# Patient Record
Sex: Male | Born: 1995 | Race: Black or African American | Hispanic: No | Marital: Single | Smoking: Former smoker
Health system: Southern US, Community
[De-identification: ages and names within clinical notes are randomized; demographics above are authoritative.]

## PROBLEM LIST (undated history)

## (undated) DIAGNOSIS — S62309A Unspecified fracture of unspecified metacarpal bone, initial encounter for closed fracture: Secondary | ICD-10-CM

---
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
## 2014-08-10 ENCOUNTER — Encounter (HOSPITAL_COMMUNITY): Payer: Self-pay | Admitting: Emergency Medicine

## 2014-08-10 ENCOUNTER — Emergency Department (HOSPITAL_COMMUNITY)
Admission: EM | Admit: 2014-08-10 | Discharge: 2014-08-10 | Disposition: A | Discharge status: Home / Self Care | Payer: No Typology Code available for payment source | Attending: Emergency Medicine | Admitting: Emergency Medicine

## 2014-08-10 DIAGNOSIS — F17200 Nicotine dependence, unspecified, uncomplicated: Secondary | ICD-10-CM | POA: Diagnosis not present

## 2014-08-10 DIAGNOSIS — K921 Melena: Secondary | ICD-10-CM | POA: Insufficient documentation

## 2014-08-10 DIAGNOSIS — K625 Hemorrhage of anus and rectum: Secondary | ICD-10-CM | POA: Insufficient documentation

## 2014-08-10 LAB — POC OCCULT BLOOD, ED: Fecal Occult Bld: NEGATIVE

## 2014-08-10 MED ORDER — HYDROCORTISONE ACETATE 25 MG RE SUPP: 25.0000 mg | Freq: Two times a day (BID) | RECTAL | Status: DC

## 2014-08-10 NOTE — ED Notes (Signed)
Pt reports 1 episode of bright red stool this AM. Denies any pain. Denies dizziness, lightheadedness. PT AO x4, NAD.

## 2014-08-10 NOTE — Discharge Instructions (Signed)
Rectal Bleeding  Rectal bleeding is when blood comes out of the opening of the butt (anus). Rectal bleeding may show up as bright red blood or really dark poop (stool). The poop may look dark red, maroon, or black. Rectal bleeding is often a sign that something is wrong. This needs to be checked by a doctor.  HOME CARE  Eat a diet high in fiber. This will help keep your poop soft.  Limit activity.  Drink enough fluids to keep your pee (urine) clear or pale yellow.  Take a warm bath to soothe any pain.  Follow up with your doctor as told. GET HELP RIGHT AWAY IF:  You have more bleeding.  You have black or dark red poop.  You throw up (vomit) blood or it looks like coffee grounds.  You have belly (abdominal) pain or tenderness.  You have a fever.  You feel weak, sick to your stomach (nauseous), or you pass out (faint).  You have pain that is so bad you cannot poop (bowel movement). MAKE SURE YOU:  Understand these instructions.  Will watch your condition.  Will get help right away if you are not doing well or get worse. Document Released: 08/16/2011 Document Revised: 04/20/2014 Document Reviewed: 08/16/2011 [employer] Patient Information 2015 [employer]. This information is not intended to replace advice given to you by your health care provider. Make sure you discuss any questions you have with your health care provider.  

## 2014-08-10 NOTE — ED Provider Notes (Signed)
CSN: [id number]     Arrival date & time 08/10/14  1101 History  This chart was scribed for non-physician practitioner, Junious Silk, PA-C,working with Toy Baker, MD, by Karle Plumber, ED Scribe. This patient was seen in room C26C/C26C and the patient's care was started at 2:09 PM.  Chief Complaint  Patient presents with  . Rectal Bleeding   Patient is a 18 y.o. male presenting with hematochezia. The history is provided by the patient. No language interpreter was used.  Rectal Bleeding Associated symptoms: no abdominal pain    HPI Comments:  Guy Clark is a 18 y.o. male who presents to the Emergency Department complaining of hematochezia that occurred one time earlier this morning. He states he saw blood in the toilet after a bowel movement and also noticed blood after wiping. The blood was not mixed in the stool. He denies constipation, abdominal pain, pain with bowel movements or any urinary symptoms. He denies taking any medications on a daily basis. Mother denies family h/o colon cancer. He does not have a PCP.  History reviewed. No pertinent past medical history. History reviewed. No pertinent past surgical history. No family history on file. History  Substance Use Topics  . Smoking status: Current Some Day Smoker  . Smokeless tobacco: Not on file  . Alcohol Use: No    Review of Systems  Gastrointestinal: Positive for hematochezia and anal bleeding. Negative for abdominal pain, constipation and blood in stool.  Genitourinary: Negative for dysuria, urgency, frequency, hematuria, decreased urine volume and difficulty urinating.  All other systems reviewed and are negative.   Allergies  Review of patient's allergies indicates no known allergies.  Home Medications   Prior to Admission medications   Not on File   Triage Vitals: BP 131/61  Pulse 66  Temp(Src) 97.8 F (36.6 C) (Oral)  Resp 20  SpO2 100% Physical Exam  Nursing note and vitals  reviewed. Constitutional: He is oriented to person, place, and time. He appears well-developed and well-nourished. No distress.  HENT:  Head: Normocephalic and atraumatic.  Right Ear: External ear normal.  Left Ear: External ear normal.  Nose: Nose normal.  Eyes: Conjunctivae are normal.  Neck: Normal range of motion. No tracheal deviation present.  Cardiovascular: Normal rate, regular rhythm and normal heart sounds.   Pulmonary/Chest: Effort normal and breath sounds normal. No stridor.  Abdominal: Soft. He exhibits no distension. There is no tenderness.  No tenderness to deep palpation of abdomen.  Genitourinary: Rectum normal.  Good sphincter tone on rectal exam. No external hemorrhoid seen. No fissures. No tenderness to palpation on rectal exam. Finger without gross blood or stool.  Musculoskeletal: Normal range of motion.  Neurological: He is alert and oriented to person, place, and time.  Skin: Skin is warm and dry. He is not diaphoretic.  Psychiatric: He has a normal mood and affect. His behavior is normal.    ED Course  Procedures (including critical care time) DIAGNOSTIC STUDIES: Oxygen Saturation is 100% on RA, normal by my interpretation.   COORDINATION OF CARE: 2:12 PM- Will send fecal sample for testing. Pt verbalizes understanding and agrees to plan.  Medications - No data to display  Labs Review Labs Reviewed  POC OCCULT BLOOD, ED    Imaging Review No results found.   EKG Interpretation None      MDM   Final diagnoses:  Rectal bleeding    Patient presents to ED for evaluation of BRBPR. This happened as 1 single occurrence. Hemoccult  negative on exam, however there was nothing on my finger during rectal exam. No external hemorrhoids or fissures. Possible internal hemorrhoids. No family history of colon cancer. No tenderness to deep palpation of abdomen. Patient is an otherwise healthy 18 year old male. Will give resource guide for PCP follow up.  Discussed reasons to return to ED immediately. Vital signs stable for discharge. Patient / Family / Caregiver informed of clinical course, understand medical decision-making process, and agree with plan.   I personally performed the services described in this documentation, which was scribed in my presence. The recorded information has been reviewed and is accurate.    Mora Bellman, PA-C 08/10/14 [phone]

## 2014-08-11 NOTE — ED Provider Notes (Signed)
Medical screening examination/treatment/procedure(s) were performed by non-physician practitioner and as supervising physician I was immediately available for consultation/collaboration.   EKG Interpretation None       Denasia Venn T Brittni Hult, MD 08/11/14 0741 

## 2014-08-24 ENCOUNTER — Emergency Department (HOSPITAL_COMMUNITY): Payer: No Typology Code available for payment source

## 2014-08-24 ENCOUNTER — Encounter (HOSPITAL_COMMUNITY): Payer: Self-pay | Admitting: Emergency Medicine

## 2014-08-24 ENCOUNTER — Emergency Department (HOSPITAL_COMMUNITY)
Admission: EM | Admit: 2014-08-24 | Discharge: 2014-08-24 | Disposition: A | Discharge status: Home / Self Care | Payer: No Typology Code available for payment source | Attending: Emergency Medicine | Admitting: Emergency Medicine

## 2014-08-24 DIAGNOSIS — S199XXA Unspecified injury of neck, initial encounter: Secondary | ICD-10-CM

## 2014-08-24 DIAGNOSIS — S069X9A Unspecified intracranial injury with loss of consciousness of unspecified duration, initial encounter: Secondary | ICD-10-CM

## 2014-08-24 DIAGNOSIS — S52123A Displaced fracture of head of unspecified radius, initial encounter for closed fracture: Secondary | ICD-10-CM | POA: Insufficient documentation

## 2014-08-24 DIAGNOSIS — F17200 Nicotine dependence, unspecified, uncomplicated: Secondary | ICD-10-CM | POA: Insufficient documentation

## 2014-08-24 DIAGNOSIS — Y9389 Activity, other specified: Secondary | ICD-10-CM | POA: Insufficient documentation

## 2014-08-24 DIAGNOSIS — S0990XA Unspecified injury of head, initial encounter: Secondary | ICD-10-CM | POA: Insufficient documentation

## 2014-08-24 DIAGNOSIS — S060X9A Concussion with loss of consciousness of unspecified duration, initial encounter: Secondary | ICD-10-CM | POA: Insufficient documentation

## 2014-08-24 DIAGNOSIS — M25521 Pain in right elbow: Secondary | ICD-10-CM

## 2014-08-24 DIAGNOSIS — S069X1A Unspecified intracranial injury with loss of consciousness of 30 minutes or less, initial encounter: Secondary | ICD-10-CM

## 2014-08-24 DIAGNOSIS — S52121A Displaced fracture of head of right radius, initial encounter for closed fracture: Secondary | ICD-10-CM

## 2014-08-24 DIAGNOSIS — S60229A Contusion of unspecified hand, initial encounter: Secondary | ICD-10-CM | POA: Insufficient documentation

## 2014-08-24 DIAGNOSIS — S0993XA Unspecified injury of face, initial encounter: Secondary | ICD-10-CM | POA: Insufficient documentation

## 2014-08-24 DIAGNOSIS — R0789 Other chest pain: Secondary | ICD-10-CM

## 2014-08-24 DIAGNOSIS — S60221A Contusion of right hand, initial encounter: Secondary | ICD-10-CM

## 2014-08-24 DIAGNOSIS — M542 Cervicalgia: Secondary | ICD-10-CM

## 2014-08-24 DIAGNOSIS — S298XXA Other specified injuries of thorax, initial encounter: Secondary | ICD-10-CM | POA: Insufficient documentation

## 2014-08-24 MED ORDER — MORPHINE SULFATE 4 MG/ML IJ SOLN
6.0000 mg | Freq: Once | INTRAMUSCULAR | Status: AC
  Administered 2014-08-24: 6 mg via INTRAMUSCULAR
  Filled 2014-08-24: qty 2

## 2014-08-24 MED ORDER — NAPROXEN 500 MG PO TABS: 500.0000 mg | ORAL_TABLET | Freq: Two times a day (BID) | ORAL | Status: DC

## 2014-08-24 MED ORDER — HYDROCODONE-ACETAMINOPHEN 5-325 MG PO TABS
2.0000 | ORAL_TABLET | ORAL | Status: DC | PRN
Start: 2014-08-24 — End: 2016-07-01

## 2014-08-24 NOTE — ED Notes (Signed)
Pt removed C-Collar. Stated it was uncomfortable and he does not want to wear it anymore. Pt has been for CT cervical spine, waiting for results.
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
## 2014-08-24 NOTE — ED Notes (Signed)
Patient being transported to x-ray.

## 2014-08-28 ENCOUNTER — Telehealth (HOSPITAL_BASED_OUTPATIENT_CLINIC_OR_DEPARTMENT_OTHER): Payer: Self-pay | Admitting: Emergency Medicine

## 2016-07-01 ENCOUNTER — Emergency Department (HOSPITAL_COMMUNITY)
Admission: EM | Admit: 2016-07-01 | Discharge: 2016-07-01 | Disposition: A | Discharge status: Home / Self Care | Payer: No Typology Code available for payment source | Source: Home / Self Care | Attending: Emergency Medicine | Admitting: Emergency Medicine

## 2016-07-01 ENCOUNTER — Encounter (HOSPITAL_COMMUNITY): Payer: Self-pay

## 2016-07-01 ENCOUNTER — Emergency Department (HOSPITAL_COMMUNITY)
Admission: EM | Admit: 2016-07-01 | Discharge: 2016-07-01 | Disposition: A | Discharge status: Home / Self Care | Payer: No Typology Code available for payment source | Attending: Emergency Medicine | Admitting: Emergency Medicine

## 2016-07-01 DIAGNOSIS — Z79899 Other long term (current) drug therapy: Secondary | ICD-10-CM

## 2016-07-01 DIAGNOSIS — F17200 Nicotine dependence, unspecified, uncomplicated: Secondary | ICD-10-CM

## 2016-07-01 DIAGNOSIS — Y990 Civilian activity done for income or pay: Secondary | ICD-10-CM | POA: Insufficient documentation

## 2016-07-01 DIAGNOSIS — S3994XA Unspecified injury of external genitals, initial encounter: Secondary | ICD-10-CM

## 2016-07-01 DIAGNOSIS — Y929 Unspecified place or not applicable: Secondary | ICD-10-CM | POA: Insufficient documentation

## 2016-07-01 DIAGNOSIS — S3021XA Contusion of penis, initial encounter: Secondary | ICD-10-CM | POA: Insufficient documentation

## 2016-07-01 DIAGNOSIS — W1839XA Other fall on same level, initial encounter: Secondary | ICD-10-CM | POA: Insufficient documentation

## 2016-07-01 DIAGNOSIS — Y9389 Activity, other specified: Secondary | ICD-10-CM

## 2016-07-01 DIAGNOSIS — Y999 Unspecified external cause status: Secondary | ICD-10-CM | POA: Insufficient documentation

## 2016-07-01 DIAGNOSIS — N4821 Abscess of corpus cavernosum and penis: Secondary | ICD-10-CM | POA: Insufficient documentation

## 2016-07-01 DIAGNOSIS — L0291 Cutaneous abscess, unspecified: Secondary | ICD-10-CM

## 2016-07-01 DIAGNOSIS — Y939 Activity, unspecified: Secondary | ICD-10-CM | POA: Insufficient documentation

## 2016-07-01 DIAGNOSIS — W2209XA Striking against other stationary object, initial encounter: Secondary | ICD-10-CM | POA: Insufficient documentation

## 2016-07-01 DIAGNOSIS — T148XXA Other injury of unspecified body region, initial encounter: Secondary | ICD-10-CM

## 2016-07-01 MED ORDER — LIDOCAINE HCL (PF) 1 % IJ SOLN
5.0000 mL | Freq: Once | INTRAMUSCULAR | Status: AC
  Administered 2016-07-01: 5 mL
  Filled 2016-07-01: qty 5

## 2016-07-01 MED ORDER — NAPROXEN 500 MG PO TABS: 500.0000 mg | ORAL_TABLET | Freq: Two times a day (BID) | ORAL | Status: DC

## 2016-07-01 MED ORDER — SULFAMETHOXAZOLE-TRIMETHOPRIM 800-160 MG PO TABS: 1.0000 | ORAL_TABLET | Freq: Two times a day (BID) | ORAL | Status: AC

## 2016-07-01 MED ORDER — HYDROCODONE-ACETAMINOPHEN 5-325 MG PO TABS: 2.0000 | ORAL_TABLET | ORAL | Status: DC | PRN

## 2016-07-01 NOTE — ED Provider Notes (Signed)
CSN: [id number]     Arrival date & time 07/01/16  1948 History  By signing my name below, I, Majel Homer, attest that this documentation has been prepared under the direction and in the presence of non-physician practitioner, Melburn Hake, PA-C. Electronically Signed: Majel Homer, Scribe. 07/01/2016. 8:20 PM.   Chief Complaint  Patient presents with  . Penis Injury   The history is provided by the patient. No language interpreter was used.   HPI Comments: Guy Clark is a 20 y.o. male who presents to the Emergency Department complaining of gradually worsening, penile pain and swelling s/p a fall that occurred 4 days ago. Pt reports he lost his balance on wet floor while at work 4 days ago and struck his penis against a door. He denies hitting his head or loss of consciousness at the time of fall. He states he took ibuprofen with mild relief. Pt was seen today at [facility] at ~6pm this evening by myself for the same symptoms in which his penis was aspirated due to concern for contusion vs abscess, no fluid was able to be aspirated and pt was d/c home with symptomatic tx and pain meds for suspected contusion. Pt states he was picking up his prescription when he felt wet near his penis and saw blood and a quarter size amount of purulent discharge from the aspiration site of his penis. Pt reports his discharge looked like a mixture of red and white fluid that he described as cloudiness. Denies fever, chills, abdominal pain, N/V, difficulty urinating, hematuria, testicular pain or swelling, worsening penile swelling.    History reviewed. No pertinent past medical history. History reviewed. No pertinent past surgical history. History reviewed. No pertinent family history. Social History  Substance Use Topics  . Smoking status: Current Some Day Smoker  . Smokeless tobacco: None  . Alcohol Use: No    Review of Systems  Constitutional: Negative for fever.  Genitourinary: Positive for penile swelling and  penile pain.   Allergies  Review of patient's allergies indicates no known allergies.  Home Medications   Prior to Admission medications   Medication Sig Start Date End Date Taking? Authorizing Provider  HYDROcodone-acetaminophen (NORCO/VICODIN) 5-325 MG tablet Take 2 tablets by mouth every 4 (four) hours as needed. 07/01/16   Barrett Henle, PA-C  naproxen (NAPROSYN) 500 MG tablet Take 1 tablet (500 mg total) by mouth 2 (two) times daily. 07/01/16   Barrett Henle, PA-C  sulfamethoxazole-trimethoprim (BACTRIM DS,SEPTRA DS) 800-160 MG tablet Take 1 tablet by mouth 2 (two) times daily. 07/01/16 07/08/16  Satira Sark Betul Brisky, PA-C   BP 143/67 mmHg  Pulse 68  Temp(Src) 98.3 F (36.8 C) (Oral)  Resp 16  Ht  (1.753 m)  Wt 204 lb 4 oz (92.647 kg)  BMI 30.15 kg/m2  SpO2 99% Physical Exam  Constitutional: He is oriented to person, place, and time. He appears well-developed and well-nourished.  HENT:  Head: Normocephalic and atraumatic.  Eyes: Conjunctivae and EOM are normal. Right eye exhibits no discharge. Left eye exhibits no discharge. No scleral icterus.  Pulmonary/Chest: Effort normal.  Genitourinary:    Right testis shows no mass, no swelling and no tenderness. Left testis shows no mass, no swelling and no tenderness. Circumcised. Penile tenderness present. No phimosis, paraphimosis, hypospadias or penile erythema.  2x1 cm area of fluctuance and swelling noted to left lateral penile shaft, TTP  No tenderness to remaining penis or penile shaft No surrounding erythema, drainage or ecchymosis noted  No active bleeding or drainage noted   Neurological: He is alert and oriented to person, place, and time.  Nursing note and vitals reviewed.   ED Course  .Marland Kitchen.Incision and Drainage Date/Time: 07/01/2016 8:45 PM Performed by: Barrett HenleNADEAU, Bhavin Monjaraz ELIZABETH Authorized by: Barrett HenleNADEAU, Austin Herd ELIZABETH Consent: Verbal consent obtained. Risks and benefits: risks, benefits and  alternatives were discussed Consent given by: patient Patient understanding: patient states understanding of the procedure being performed Patient identity confirmed: verbally with patient Type: abscess Location: penile shaft. Anesthesia: local infiltration Local anesthetic: lidocaine 1% without epinephrine Anesthetic total: 2 ml Scalpel size: 11 Needle gauge: 25. Incision type: single straight Incision depth: dermal Complexity: simple Drainage: purulent Drainage amount: moderate Wound treatment: wound left open Packing material: none Patient tolerance: Patient tolerated the procedure well with no immediate complications    DIAGNOSTIC STUDIES:  Oxygen Saturation is 99% on RA, normal by my interpretation.    COORDINATION OF CARE:  8:18 PM Discussed treatment plan, which includes I&D of penis with pt at bedside and pt agreed to plan.  Labs Review Labs Reviewed - No data to display  Imaging Review No results found. I have personally reviewed and evaluated these images and lab results as part of my medical decision-making.   EKG Interpretation None      MDM   Final diagnoses:  Abscess    Patient presents with drainage from penile abscess. Patient was seen in the ED earlier this evening by myself for penile pain and swelling after falling resulting in a contusion to his penile shaft. The pt's penis was aspirated due to concern for contusion vs abscess, no fluid was able to be aspirated and pt was d/c home with symptomatic tx and pain meds for suspected contusion. Patient reports a few hours after leaving the ED he noticed a small amount of blood and purulent drainage from aspiration site. Denies fever or any other changes in his sxs. VSS. Exam revealed area of fluctuance to left lateral penile shaft with tenderness to palpation, no drainage. Exam unchanged from initial evaluation earlier this evening. Due to patient reporting purulent drainage, will perform I&D for suspected  abscess. I&D of area resulted in moderate amount of purulent drainage to superficial skin abscess. Procedure completed without any complications. Plan to discharge patient home with Bactrim, wound care and plan to follow up with PCP in 2 days for wound recheck. Discussed return precautions with patient.  I personally performed the services described in this documentation, which was scribed in my presence. The recorded information has been reviewed and is accurate.    Satira Sarkicole Elizabeth SpragueNadeau, [place] JerseyPA-C 07/01/16 2136  Bethann BerkshireJoseph Zammit, MD 07/01/16 2245

## 2016-07-01 NOTE — ED Notes (Signed)
Pt reports injury to penis 06-27-16.  Was seen in ED today for same.  Pt noted bleeding on way home from hospital, returns to have examined.  No bleeding at this time.

## 2016-07-01 NOTE — ED Provider Notes (Signed)
CSN: [id number]     Arrival date & time 07/01/16  1610 History  By signing my name below, I, Majel Homer, attest that this documentation has been prepared under the direction and in the presence of non-physician practitioner, Melburn Hake, PA-C. Electronically Signed: Majel Homer, Scribe. 07/01/2016. 6:04 PM.  Chief Complaint  Patient presents with  . Groin Swelling   The history is provided by the patient. No language interpreter was used.   HPI Comments: Guy Clark is a 20 y.o. male who presents to the Emergency Department complaining of gradually worsening, penile pain s/p a fall that occurred 4 days ago and worsened today. Pt notes associated penile swelling. Pt reports he loss his balance on wet floor while at work 4 days ago and struck his penis against a door. He denies hitting his head or loss of consciousness at the time of his fall. He states he took ibuprofen yesterday with mild intermittent relief. Pt denies difficulty urinating, dysuria, hematuria, swelling or pain in his testicles, penile discharge, abdominal pain, nausea, and vomiting.   History reviewed. No pertinent past medical history. History reviewed. No pertinent past surgical history. No family history on file. Social History  Substance Use Topics  . Smoking status: Current Some Day Smoker  . Smokeless tobacco: None  . Alcohol Use: No    Review of Systems  Gastrointestinal: Negative for nausea, vomiting and abdominal pain.  Genitourinary: Positive for penile swelling. Negative for dysuria, hematuria, difficulty urinating, penile pain and testicular pain.  Musculoskeletal: Positive for myalgias.  Neurological: Negative for syncope.   Allergies  Review of patient's allergies indicates no known allergies.  Home Medications   Prior to Admission medications   Medication Sig Start Date End Date Taking? Authorizing Provider  HYDROcodone-acetaminophen (NORCO/VICODIN) 5-325 MG tablet Take 2 tablets by mouth every 4  (four) hours as needed. 07/01/16   Barrett Henle, PA-C  naproxen (NAPROSYN) 500 MG tablet Take 1 tablet (500 mg total) by mouth 2 (two) times daily. 07/01/16   Satira Sark Chiquitta Matty, PA-C   BP 131/72 mmHg  Temp(Src) 98.5 F (36.9 C) (Oral)  Resp 18  SpO2 99% Physical Exam  Constitutional: He is oriented to person, place, and time. He appears well-developed and well-nourished.  HENT:  Head: Normocephalic and atraumatic.  Eyes: Conjunctivae and EOM are normal. Right eye exhibits no discharge. Left eye exhibits no discharge. No scleral icterus.  Cardiovascular: Normal rate, regular rhythm, normal heart sounds and intact distal pulses.   Pulmonary/Chest: Effort normal and breath sounds normal.  Abdominal: Soft. Bowel sounds are normal. There is no tenderness. Hernia confirmed negative in the right inguinal area and confirmed negative in the left inguinal area.  Genitourinary: Testes normal.    Right testis shows no mass, no swelling and no tenderness. Left testis shows no mass, no swelling and no tenderness. Circumcised. Penile tenderness present. No phimosis, paraphimosis, hypospadias or penile erythema. No discharge found.  2x1 cm area of fluctuance and swelling noted to left lateral penile shaft, TTP  No tenderness to remaining penis or penile shaft No surrounding erythema, drainage or ecchymosis noted  Musculoskeletal: He exhibits no edema.  Lymphadenopathy:       Right: No inguinal adenopathy present.  Neurological: He is alert and oriented to person, place, and time.  Skin: Skin is warm and dry.  Nursing note and vitals reviewed.  ED Course  Procedures  DIAGNOSTIC STUDIES:  Oxygen Saturation is 99% on RA, normal by my interpretation.  COORDINATION OF CARE:  5:58 PM Discussed treatment plan with pt at bedside and pt agreed to plan.  Labs Review Labs Reviewed - No data to display  Imaging Review No results found. I have personally reviewed and evaluated these  images and lab results as part of my medical decision-making.   EKG Interpretation None      MDM   Final diagnoses:  Contusion  Penile trauma, initial encounter    Patient presents with penile pain and swelling status post fall that occurred 5 days ago. Denies head injury or LOC. Denies use of anticoagulants. Denies any other associated symptoms. VSS. Exam revealed area of swelling and fluctuance to left lateral penile shaft, tender to palpation, no surrounding erythema drainage or ecchymosis. Remaining GU exam unremarkable. Abdominal exam benign. Due to concern for contusion versus abscess, area was aspirated. No fluid, blood or pus was aspirated. I suspect patient's symptoms are likely due to contusion associated with recent injury. Plan to discharge patient home with pain meds and symptomatic treatment. Advised patient to follow up with his family doctor in 5 days for wound recheck. Discussed return precautions with patient.  I personally performed the services described in this documentation, which was scribed in my presence. The recorded information has been reviewed and is accurate.    Satira Sarkicole Elizabeth Misericordia UniversityNadeau, [place] JerseyPA-C 07/01/16 1852  Bethann BerkshireJoseph Zammit, MD 07/01/16 2245

## 2016-07-01 NOTE — Discharge Instructions (Signed)
Take your antibiotic prescription as prescribed until he completed the entire prescription. I recommended keeping wound clean using antibacterial soap and water, pat dry. Continue taking your prescriptions as prescribed as needed for pain relief. Follow up with your family doctor in the next 2 days for wound recheck. Return to the emergency department if symptoms worsen or new onset of fever, worsening redness, swelling, warmth, drainage, difficulty urinating, blood in urine, unable to urinate, abdominal pain, penile/testicular swelling, testicular pain.

## 2016-07-01 NOTE — ED Notes (Signed)
Pt reports relief. Dressing applied

## 2016-07-01 NOTE — ED Notes (Signed)
Patient was at work this past Tuesday and hit groin/penis on door edge. Complains of ongoing sweeling and discomfort to penis, has iced and ibuprofen with mild relief

## 2016-07-01 NOTE — ED Notes (Signed)
Mother w pt 

## 2016-07-01 NOTE — ED Notes (Signed)
PA at bedside.

## 2016-07-01 NOTE — ED Notes (Signed)
Pt given gown to put on for assessment

## 2016-07-01 NOTE — Discharge Instructions (Signed)
Take your medications as prescribed. I also recommend resting and applying ice to affected area for 15 minutes 3-4 times daily to help with pain and swelling. Follow-up with your family doctor within the next week for wound recheck. Return to the emergency department if symptoms worsen or new onset of fever, redness, swelling, warmth, worsening tenderness, drainage, difficulty urinating, blood in urine, penile discharge, penile swelling.

## 2017-06-17 DIAGNOSIS — S62309A Unspecified fracture of unspecified metacarpal bone, initial encounter for closed fracture: Secondary | ICD-10-CM

## 2017-06-17 HISTORY — DX: Unspecified fracture of unspecified metacarpal bone, initial encounter for closed fracture: S62.309A

## 2017-06-18 ENCOUNTER — Emergency Department (HOSPITAL_COMMUNITY): Payer: No Typology Code available for payment source

## 2017-06-18 ENCOUNTER — Encounter: Payer: Self-pay | Admitting: Orthopedic Surgery

## 2017-06-18 ENCOUNTER — Encounter (HOSPITAL_COMMUNITY): Payer: Self-pay | Admitting: Emergency Medicine

## 2017-06-18 ENCOUNTER — Emergency Department (HOSPITAL_COMMUNITY)
Admission: EM | Admit: 2017-06-18 | Discharge: 2017-06-18 | Disposition: A | Discharge status: Home / Self Care | Payer: No Typology Code available for payment source | Attending: Emergency Medicine | Admitting: Emergency Medicine

## 2017-06-18 DIAGNOSIS — W2189XA Striking against or struck by other sports equipment, initial encounter: Secondary | ICD-10-CM | POA: Insufficient documentation

## 2017-06-18 DIAGNOSIS — Z791 Long term (current) use of non-steroidal anti-inflammatories (NSAID): Secondary | ICD-10-CM | POA: Insufficient documentation

## 2017-06-18 DIAGNOSIS — Y929 Unspecified place or not applicable: Secondary | ICD-10-CM | POA: Insufficient documentation

## 2017-06-18 DIAGNOSIS — Y999 Unspecified external cause status: Secondary | ICD-10-CM | POA: Insufficient documentation

## 2017-06-18 DIAGNOSIS — S62315A Displaced fracture of base of fourth metacarpal bone, left hand, initial encounter for closed fracture: Secondary | ICD-10-CM | POA: Insufficient documentation

## 2017-06-18 DIAGNOSIS — Y9371 Activity, boxing: Secondary | ICD-10-CM | POA: Insufficient documentation

## 2017-06-18 DIAGNOSIS — F17200 Nicotine dependence, unspecified, uncomplicated: Secondary | ICD-10-CM | POA: Insufficient documentation

## 2017-06-18 MED ORDER — OXYCODONE-ACETAMINOPHEN 5-325 MG PO TABS
2.0000 | ORAL_TABLET | Freq: Once | ORAL | Status: AC
  Administered 2017-06-18: 2 via ORAL
  Filled 2017-06-18: qty 2

## 2017-06-18 MED ORDER — IBUPROFEN 800 MG PO TABS: 800.0000 mg | ORAL_TABLET | Freq: Three times a day (TID) | ORAL | 0 refills | Status: DC

## 2017-06-18 MED ORDER — OXYCODONE-ACETAMINOPHEN 5-325 MG PO TABS: 1.0000 | ORAL_TABLET | Freq: Four times a day (QID) | ORAL | 0 refills | Status: DC | PRN

## 2017-06-18 NOTE — ED Triage Notes (Signed)
Patient states that when he was practicing at gym when punching on bag, reports knuckles on left hand when backwards. Patient reports swelling and pain. Patient has hand wrapped in ace bandage.

## 2017-06-18 NOTE — Discharge Instructions (Addendum)
Keep splint intact and dry.  No lifting with left hand.  Take ibuprofen every 8 hours for your pain. For breakthrough pain, take 1-2 Percocet every 4-6 hours as needed for severe pain. Please follow-up with Dr. Carollee Massedhompson's office as directed.

## 2017-06-18 NOTE — ED Provider Notes (Signed)
[facility]-EMERGENCY DEPT Provider Note   CSN: [id number] Arrival date & time: 06/18/17  1028   By signing my name below, I, Guy Clark, attest that this documentation has been prepared under the direction and in the presence of Guy ReamAlexandra Emmert Roethler, PA-C Electronically Signed: Soijett Clark, ED Scribe. 06/18/17. 12:31 PM.  History   Chief Complaint Chief Complaint  Patient presents with  . Hand Injury    HPI Guy Clark is a 21 y.o. male who presents to the Emergency Department complaining of left hand injury occurring yesterday night. Pt reports associated left hand swelling, throbbing left hand pain, tingling sensation to left 4th fingers, and numbness to left 5th finger. Pt has tried applying an ace wrap without ice or medications with no relief of his symptoms. He reports that he was striking a punching bag at the gym when his left knuckles went backwards. Pt denies eating anything today PTA. He denies color change, wound, and any other symptoms.    The history is provided by the patient. No language interpreter was used.    History reviewed. No pertinent past medical history.  There are no active problems to display for this patient.   History reviewed. No pertinent surgical history.     Home Medications    Prior to Admission medications   Medication Sig Start Date End Date Taking? Authorizing Provider  HYDROcodone-acetaminophen (NORCO/VICODIN) 5-325 MG tablet Take 2 tablets by mouth every 4 (four) hours as needed. 07/01/16   Barrett HenleNadeau, Guy Elizabeth, PA-C  ibuprofen (ADVIL,MOTRIN) 800 MG tablet Take 1 tablet (800 mg total) by mouth 3 (three) times daily. 06/18/17   Curran Lenderman, Guy BogaAlexandra M, PA-C  naproxen (NAPROSYN) 500 MG tablet Take 1 tablet (500 mg total) by mouth 2 (two) times daily. 07/01/16   Barrett HenleNadeau, Guy Elizabeth, PA-C  oxyCODONE-acetaminophen (PERCOCET/ROXICET) 5-325 MG tablet Take 1-2 tablets by mouth every 6 (six) hours as needed for severe pain. 06/18/17   Emi HolesLaw, Guy Erker M, PA-C      Family History No family history on file.  Social History Social History  Substance Use Topics  . Smoking status: Current Some Day Smoker  . Smokeless tobacco: Never Used  . Alcohol use No     Allergies   Patient has no known allergies.   Review of Systems Review of Systems  Musculoskeletal: Positive for arthralgias (left hand) and joint swelling (left hand).  Skin: Negative for color change and wound.  Neurological: Positive for numbness (upper portion of left 5th finger).       +Tingling sensation to left 4th finger     Physical Exam Updated Vital Signs BP (!) 143/93   Pulse 66   Temp 98.2 F (36.8 C)   Resp 14   Ht 5\' 10"  (1.778 Clark)   Wt 215 lb (97.5 kg)   SpO2 98%   BMI 30.85 kg/Clark   Physical Exam  Constitutional: He is oriented to person, place, and time. He appears well-developed and well-nourished. No distress.  HENT:  Head: Normocephalic and atraumatic.  Eyes: EOM are normal.  Neck: Neck supple.  Cardiovascular: Normal rate, regular rhythm, normal heart sounds and intact distal pulses.  Exam reveals no gallop and no friction rub.   No murmur heard. Pulmonary/Chest: Effort normal. No respiratory distress.  Abdominal: He exhibits no distension.  Musculoskeletal:       Left hand: He exhibits decreased range of motion. He exhibits normal capillary refill.  Left hand: Flexion of left fourth digit, but no extension. Limited flexion of 5th  digit with no extension. Dorsal tenderness to fourth metacarpal. Palpable displaced bone. No 2 point discrimination to left 5th digit. Sensation intact to the left 4th digit. Cap refill intact.   Neurological: He is alert and oriented to person, place, and time.  Skin: Skin is warm and dry.  Psychiatric: He has a normal mood and affect. His behavior is normal.  Nursing note and vitals reviewed.    ED Treatments / Results  DIAGNOSTIC STUDIES: Oxygen Saturation is 98% on RA, nl by my interpretation.    COORDINATION  OF CARE: 12:30 PM Discussed treatment plan with pt at bedside which includes left hand xray, and pt agreed to plan.  12:52 PM- Consult with Orthopedic PA-C Charma Igo who informs that he will evaluate the pt in the ED.   1:44 PM- Orthopedic PA, Charma Igo to evaluate the pt and recommends splinting the left hand with an ulnar gutter splint, d/c with pain medications, and follow up in office for further evaluation.  Labs (all labs ordered are listed, but only abnormal results are displayed) Labs Reviewed - No data to display  EKG  EKG Interpretation None       Radiology Dg Hand Complete Left  Result Date: 06/18/2017 CLINICAL DATA:  21 year old male boxing and felt pain. Initial encounter. EXAM: LEFT HAND - COMPLETE 3+ VIEW COMPARISON:  None. FINDINGS: Transverse fracture of the proximal to mid aspect of the left fourth metacarpal shaft with posterior angulation of the distal fracture fragment and slight overlapping of fracture fragments. No other fracture noted. Question mild cystic changes of the lunate and triquetrum. IMPRESSION: Transverse fracture of the proximal to mid aspect of the left fourth metacarpal shaft with posterior angulation of the distal fracture fragment and slight overlapping of fracture fragments. Electronically Signed   By: Lacy Duverney Clark.D.   On: 06/18/2017 11:25    Procedures Procedures (including critical care time)  SPLINT APPLICATION Date/Time: 5:28 PM Authorized by: Guy Gilmore, PA-C Consent: Verbal consent obtained. Risks and benefits: risks, benefits and alternatives were discussed Consent given by: patient Splint applied by: orthopedic technician Location details: Left hand Splint type: ulnar gutter Supplies used: fiberglass, ACE wrap Post-procedure: The splinted body part was neurovascularly unchanged following the procedure. Patient tolerance: Patient tolerated the procedure well with no immediate  complications.     Medications Ordered in ED Medications  oxyCODONE-acetaminophen (PERCOCET/ROXICET) 5-325 MG per tablet 2 tablet (2 tablets Oral Given 06/18/17 1434)     Initial Impression / Assessment and Plan / ED Course  I have reviewed the triage vital signs and the nursing notes.  Pertinent labs & imaging results that were available during my care of the patient were reviewed by me and considered in my medical decision making (see chart for details).     X-ray of the left hand shows Transverse fracture of the proximal to mid aspect of the left fourth metacarpal shaft with posterior angulation of the distal fracture fragment and slight overlapping of fracture fragments. I consulted orthopedics and patient was evaluated by Charma Igo, PA-C. Patient will follow up with hand surgeon, Dr. Janee Morn, outpatient. Patient was placed in ulnar gutter splint. Patient discharged home with Percocet and ibuprofen for pain control. I reviewed the narcotic database and found no discrepancies. Patient understands and agrees with plan. Patient vitals stable throughout ED course and discharged in satisfactory condition.  Final Clinical Impressions(s) / ED Diagnoses   Final diagnoses:  Closed displaced fracture of base of fourth metacarpal bone  of left hand, initial encounter    New Prescriptions Discharge Medication List as of 06/18/2017  2:54 PM    START taking these medications   Details  ibuprofen (ADVIL,MOTRIN) 800 MG tablet Take 1 tablet (800 mg total) by mouth 3 (three) times daily., Starting Mon 06/18/2017, Print    oxyCODONE-acetaminophen (PERCOCET/ROXICET) 5-325 MG tablet Take 1-2 tablets by mouth every 6 (six) hours as needed for severe pain., Starting Mon 06/18/2017, Print       I personally performed the services described in this documentation, which was scribed in my presence. The recorded information has been reviewed and is accurate.     Emi Holes, PA-C 06/18/17  1728    Mancel Bale, MD 06/18/17 2045

## 2017-06-18 NOTE — Consult Note (Signed)
Reason for Consult:4th [facility] fx Referring Physician: E Deantre Bourdon is an 21 y.o. male.  HPI: Guy Clark was working on a heavy bag last night when he felt his knuckles bend backward and had pain. He didn't think it was too bad and went home. It was still hurting and swollen this morning so he came to the ED for evaluation. X-rays showed a left 4th MC fx and hand surgery was consulted. He is RHD.  History reviewed. No pertinent past medical history.  History reviewed. No pertinent surgical history.  No family history on file.  Social History:  reports that he has been smoking.  He has never used smokeless tobacco. He reports that he does not drink alcohol or use drugs.  Allergies: No Known Allergies  Medications: I have reviewed the patient's current medications.  No results found for this or any previous visit (from the past 48 hour(s)).  Dg Hand Complete Left  Result Date: 06/18/2017 CLINICAL DATA:  21 year old male boxing and felt pain. Initial encounter. EXAM: LEFT HAND - COMPLETE 3+ VIEW COMPARISON:  None. FINDINGS: Transverse fracture of the proximal to mid aspect of the left fourth metacarpal shaft with posterior angulation of the distal fracture fragment and slight overlapping of fracture fragments. No other fracture noted. Question mild cystic changes of the lunate and triquetrum. IMPRESSION: Transverse fracture of the proximal to mid aspect of the left fourth metacarpal shaft with posterior angulation of the distal fracture fragment and slight overlapping of fracture fragments. Electronically Signed   By: Lacy Duverney M.D.   On: 06/18/2017 11:25    Review of Systems  Constitutional: Negative for weight loss.  HENT: Negative for ear discharge, ear pain, hearing loss and tinnitus.   Eyes: Negative for blurred vision, double vision, photophobia and pain.  Respiratory: Negative for cough, sputum production and shortness of breath.   Cardiovascular: Negative for chest pain.   Gastrointestinal: Negative for abdominal pain, nausea and vomiting.  Genitourinary: Negative for dysuria, flank pain, frequency and urgency.  Musculoskeletal: Positive for joint pain (Left hand). Negative for back pain, falls, myalgias and neck pain.  Neurological: Negative for dizziness, tingling, sensory change, focal weakness, loss of consciousness and headaches.  Endo/Heme/Allergies: Does not bruise/bleed easily.  Psychiatric/Behavioral: Negative for depression, memory loss and substance abuse. The patient is not nervous/anxious.    Blood pressure (!) 143/93, pulse 66, temperature 98.2 F (36.8 C), resp. rate 14, height 5\' 10"  (1.778 m), weight 97.5 kg (215 lb), SpO2 98 %. Physical Exam  Constitutional: He appears well-developed and well-nourished. No distress.  HENT:  Head: Normocephalic.  Eyes: Conjunctivae are normal. Right eye exhibits no discharge. Left eye exhibits no discharge. No scleral icterus.  Cardiovascular: Normal rate and regular rhythm.   Respiratory: Effort normal. No respiratory distress.  Musculoskeletal:  Left shoulder, elbow, wrist, digits- no skin wounds, hand TTP ulnar w/edema, no instability, motion limited 2/2 pain  Sens  Ax/R/M/U intact, 5th digit and hypothenar numb  Mot   Ax/ R/ PIN/ M/ AIN/ U intact but limited 2/2 pain  Rad 2+   Neurological: He is alert.  Skin: Skin is warm and dry. He is not diaphoretic.  Psychiatric: He has a normal mood and affect. His behavior is normal.    Assessment/Plan: Boxing injury Left 4th MC fx -- Angulated by about 45 degrees by x-ray so will likely need CRPP. Will place in ulnar gutter splint, keep NWB, and plan to see as OP with Dr. Janee Morn. The office  will call pt to set up appt.    Guy CaldronMichael J. Jeffery, PA-C Orthopedic Surgery [phone] 06/18/2017, 1:32 PM    4MC midshaft angulated and displaced fx, closed, will benefit from skeletal reduction and fixation.  Arrangements to be made for surgery in the next  few days.  Neil Crouchave Osbaldo Mark, MD Hand Surgery

## 2017-06-19 ENCOUNTER — Other Ambulatory Visit: Payer: Self-pay | Admitting: Orthopedic Surgery

## 2017-06-19 ENCOUNTER — Encounter (HOSPITAL_BASED_OUTPATIENT_CLINIC_OR_DEPARTMENT_OTHER): Payer: Self-pay | Admitting: *Deleted

## 2017-06-25 ENCOUNTER — Encounter (HOSPITAL_BASED_OUTPATIENT_CLINIC_OR_DEPARTMENT_OTHER): Payer: Self-pay | Admitting: *Deleted

## 2017-06-25 ENCOUNTER — Ambulatory Visit (HOSPITAL_BASED_OUTPATIENT_CLINIC_OR_DEPARTMENT_OTHER)
Admission: RE | Admit: 2017-06-25 | Discharge: 2017-06-25 | Disposition: A | Discharge status: Home / Self Care | Payer: Self-pay | Source: Ambulatory Visit | Attending: Orthopedic Surgery | Admitting: Orthopedic Surgery

## 2017-06-25 ENCOUNTER — Ambulatory Visit (HOSPITAL_BASED_OUTPATIENT_CLINIC_OR_DEPARTMENT_OTHER): Payer: Self-pay | Admitting: Anesthesiology

## 2017-06-25 ENCOUNTER — Encounter (HOSPITAL_BASED_OUTPATIENT_CLINIC_OR_DEPARTMENT_OTHER)
Admission: RE | Disposition: A | Discharge status: Home / Self Care | Payer: Self-pay | Source: Ambulatory Visit | Attending: Orthopedic Surgery

## 2017-06-25 ENCOUNTER — Ambulatory Visit (HOSPITAL_COMMUNITY): Payer: Self-pay

## 2017-06-25 DIAGNOSIS — Z87891 Personal history of nicotine dependence: Secondary | ICD-10-CM | POA: Insufficient documentation

## 2017-06-25 DIAGNOSIS — X58XXXA Exposure to other specified factors, initial encounter: Secondary | ICD-10-CM | POA: Insufficient documentation

## 2017-06-25 DIAGNOSIS — S62325A Displaced fracture of shaft of fourth metacarpal bone, left hand, initial encounter for closed fracture: Secondary | ICD-10-CM | POA: Insufficient documentation

## 2017-06-25 DIAGNOSIS — Z419 Encounter for procedure for purposes other than remedying health state, unspecified: Secondary | ICD-10-CM

## 2017-06-25 HISTORY — DX: Unspecified fracture of unspecified metacarpal bone, initial encounter for closed fracture: S62.309A

## 2017-06-25 HISTORY — PX: CLOSED REDUCTION METACARPAL WITH PERCUTANEOUS PINNING: SHX5613

## 2017-06-25 SURGERY — CLOSED REDUCTION, FRACTURE, METACARPAL BONE, WITH PERCUTANEOUS PINNING
Anesthesia: General | Site: Finger | Laterality: Left

## 2017-06-25 MED ORDER — BUPIVACAINE-EPINEPHRINE (PF) 0.5% -1:200000 IJ SOLN
INTRAMUSCULAR | Status: DC | PRN
  Administered 2017-06-25: 10 mL via PERINEURAL

## 2017-06-25 MED ORDER — OXYCODONE HCL 5 MG PO TABS: 5.0000 mg | ORAL_TABLET | Freq: Once | ORAL | Status: DC | PRN

## 2017-06-25 MED ORDER — LIDOCAINE HCL (CARDIAC) 20 MG/ML IV SOLN
INTRAVENOUS | Status: DC | PRN
  Administered 2017-06-25: 60 mg via INTRAVENOUS

## 2017-06-25 MED ORDER — HYDROMORPHONE HCL 1 MG/ML IJ SOLN
INTRAMUSCULAR | Status: AC
  Filled 2017-06-25: qty 0.5

## 2017-06-25 MED ORDER — ONDANSETRON HCL 4 MG/2ML IJ SOLN: 4.0000 mg | Freq: Four times a day (QID) | INTRAMUSCULAR | Status: DC | PRN

## 2017-06-25 MED ORDER — ONDANSETRON HCL 4 MG/2ML IJ SOLN
INTRAMUSCULAR | Status: DC | PRN
  Administered 2017-06-25: 4 mg via INTRAVENOUS

## 2017-06-25 MED ORDER — OXYCODONE HCL 5 MG/5ML PO SOLN: 5.0000 mg | Freq: Once | ORAL | Status: DC | PRN

## 2017-06-25 MED ORDER — CEFAZOLIN SODIUM-DEXTROSE 2-4 GM/100ML-% IV SOLN
INTRAVENOUS | Status: AC
  Filled 2017-06-25: qty 100

## 2017-06-25 MED ORDER — FENTANYL CITRATE (PF) 100 MCG/2ML IJ SOLN
50.0000 ug | INTRAMUSCULAR | Status: AC | PRN
  Administered 2017-06-25 (×2): 50 ug via INTRAVENOUS
  Administered 2017-06-25: 25 ug via INTRAVENOUS

## 2017-06-25 MED ORDER — HYDROMORPHONE HCL 1 MG/ML IJ SOLN
0.2500 mg | INTRAMUSCULAR | Status: DC | PRN
  Administered 2017-06-25 (×2): 0.25 mg via INTRAVENOUS

## 2017-06-25 MED ORDER — ACETAMINOPHEN 325 MG PO TABS: 650.0000 mg | ORAL_TABLET | Freq: Four times a day (QID) | ORAL | Status: DC | PRN

## 2017-06-25 MED ORDER — DEXAMETHASONE SODIUM PHOSPHATE 10 MG/ML IJ SOLN
INTRAMUSCULAR | Status: AC
  Filled 2017-06-25: qty 2

## 2017-06-25 MED ORDER — LACTATED RINGERS IV SOLN: INTRAVENOUS | Status: DC

## 2017-06-25 MED ORDER — SCOPOLAMINE 1 MG/3DAYS TD PT72
1.0000 | MEDICATED_PATCH | Freq: Once | TRANSDERMAL | Status: DC | PRN
Start: 2017-06-25 — End: 2017-06-25

## 2017-06-25 MED ORDER — MIDAZOLAM HCL 2 MG/2ML IJ SOLN
INTRAMUSCULAR | Status: AC
  Filled 2017-06-25: qty 2

## 2017-06-25 MED ORDER — LACTATED RINGERS IV SOLN
INTRAVENOUS | Status: DC
  Administered 2017-06-25 (×2): via INTRAVENOUS

## 2017-06-25 MED ORDER — MIDAZOLAM HCL 2 MG/2ML IJ SOLN
1.0000 mg | INTRAMUSCULAR | Status: DC | PRN
  Administered 2017-06-25: 2 mg via INTRAVENOUS

## 2017-06-25 MED ORDER — FENTANYL CITRATE (PF) 100 MCG/2ML IJ SOLN
INTRAMUSCULAR | Status: AC
  Filled 2017-06-25: qty 2

## 2017-06-25 MED ORDER — IBUPROFEN 200 MG PO TABS: 600.0000 mg | ORAL_TABLET | Freq: Four times a day (QID) | ORAL | Status: DC | PRN

## 2017-06-25 MED ORDER — DEXAMETHASONE SODIUM PHOSPHATE 10 MG/ML IJ SOLN
INTRAMUSCULAR | Status: DC | PRN
  Administered 2017-06-25: 10 mg via INTRAVENOUS

## 2017-06-25 MED ORDER — PROPOFOL 10 MG/ML IV BOLUS
INTRAVENOUS | Status: DC | PRN
  Administered 2017-06-25: 260 mg via INTRAVENOUS

## 2017-06-25 MED ORDER — LIDOCAINE HCL (CARDIAC) 20 MG/ML IV SOLN
INTRAVENOUS | Status: AC
  Filled 2017-06-25: qty 20

## 2017-06-25 MED ORDER — CEFAZOLIN SODIUM-DEXTROSE 2-4 GM/100ML-% IV SOLN
2.0000 g | INTRAVENOUS | Status: AC
  Administered 2017-06-25: 2 g via INTRAVENOUS

## 2017-06-25 MED ORDER — PROPOFOL 500 MG/50ML IV EMUL
INTRAVENOUS | Status: AC
  Filled 2017-06-25: qty 50

## 2017-06-25 MED ORDER — ONDANSETRON HCL 4 MG/2ML IJ SOLN
INTRAMUSCULAR | Status: AC
  Filled 2017-06-25: qty 14

## 2017-06-25 SURGICAL SUPPLY — 42 items
BANDAGE COBAN STERILE 2 (GAUZE/BANDAGES/DRESSINGS) IMPLANT
BLADE MINI RND TIP GREEN BEAV (BLADE) IMPLANT
BLADE SURG 15 STRL LF DISP TIS (BLADE) ×1 IMPLANT
BLADE SURG 15 STRL SS (BLADE) ×2
BNDG COHESIVE 4X5 TAN STRL (GAUZE/BANDAGES/DRESSINGS) ×3 IMPLANT
BNDG ESMARK 4X9 LF (GAUZE/BANDAGES/DRESSINGS) IMPLANT
BNDG GAUZE ELAST 4 BULKY (GAUZE/BANDAGES/DRESSINGS) ×3 IMPLANT
CHLORAPREP W/TINT 26ML (MISCELLANEOUS) ×3 IMPLANT
CORD BIPOLAR FORCEPS 12FT (ELECTRODE) IMPLANT
COVER BACK TABLE 60X90IN (DRAPES) ×3 IMPLANT
COVER MAYO STAND STRL (DRAPES) ×3 IMPLANT
CUFF TOURNIQUET SINGLE 18IN (TOURNIQUET CUFF) IMPLANT
DRAPE C-ARM 42X72 X-RAY (DRAPES) ×3 IMPLANT
DRAPE EXTREMITY T 121X128X90 (DRAPE) ×3 IMPLANT
DRAPE SURG 17X23 STRL (DRAPES) ×3 IMPLANT
DRSG EMULSION OIL 3X3 NADH (GAUZE/BANDAGES/DRESSINGS) ×3 IMPLANT
GAUZE SPONGE 4X4 12PLY STRL LF (GAUZE/BANDAGES/DRESSINGS) ×3 IMPLANT
GLOVE BIO SURGEON STRL SZ7.5 (GLOVE) ×3 IMPLANT
GLOVE BIOGEL PI IND STRL 7.0 (GLOVE) ×1 IMPLANT
GLOVE BIOGEL PI IND STRL 8 (GLOVE) ×2 IMPLANT
GLOVE BIOGEL PI INDICATOR 7.0 (GLOVE) ×2
GLOVE BIOGEL PI INDICATOR 8 (GLOVE) ×4
GLOVE ECLIPSE 6.5 STRL STRAW (GLOVE) ×3 IMPLANT
GOWN STRL REUS W/TWL XL LVL3 (GOWN DISPOSABLE) ×3 IMPLANT
K-WIRE .045X4 (WIRE) IMPLANT
NEEDLE HYPO 22GX1.5 SAFETY (NEEDLE) IMPLANT
NS IRRIG 1000ML POUR BTL (IV SOLUTION) ×3 IMPLANT
PACK BASIN DAY SURGERY FS (CUSTOM PROCEDURE TRAY) ×3 IMPLANT
PADDING CAST ABS 4INX4YD NS (CAST SUPPLIES)
PADDING CAST ABS COTTON 4X4 ST (CAST SUPPLIES) IMPLANT
RUBBERBAND STERILE (MISCELLANEOUS) ×3 IMPLANT
SPLINT PLASTER CAST XFAST 4X15 (CAST SUPPLIES) IMPLANT
SPLINT PLASTER XTRA FAST SET 4 (CAST SUPPLIES)
STOCKINETTE 6  STRL (DRAPES) ×2
STOCKINETTE 6 STRL (DRAPES) ×1 IMPLANT
SUT VICRYL RAPIDE 4-0 (SUTURE) IMPLANT
SUT VICRYL RAPIDE 4/0 PS 2 (SUTURE) IMPLANT
SYR 10ML LL (SYRINGE) IMPLANT
SYR BULB 3OZ (MISCELLANEOUS) IMPLANT
TOWEL OR 17X24 6PK STRL BLUE (TOWEL DISPOSABLE) ×3 IMPLANT
TOWEL OR NON WOVEN STRL DISP B (DISPOSABLE) ×3 IMPLANT
UNDERPAD 30X30 (UNDERPADS AND DIAPERS) ×3 IMPLANT

## 2017-06-25 NOTE — Anesthesia Procedure Notes (Signed)
Procedure Name: LMA Insertion Date/Time: 06/25/2017 11:19 AM Performed by: Iya Hamed D Pre-anesthesia Checklist: Patient identified, Emergency Drugs available, Suction available and Patient being monitored Patient Re-evaluated:Patient Re-evaluated prior to inductionOxygen Delivery Method: Circle system utilized Preoxygenation: Pre-oxygenation with 100% oxygen Intubation Type: IV induction Ventilation: Mask ventilation without difficulty LMA: LMA inserted LMA Size: 4.0 Number of attempts: 1 Airway Equipment and Method: Bite block Placement Confirmation: positive ETCO2 Tube secured with: Tape Dental Injury: Teeth and Oropharynx as per pre-operative assessment

## 2017-06-25 NOTE — Discharge Instructions (Addendum)
Discharge Instructions   You have a dressing with a plaster splint incorporated in it. Move your fingers as much as possible, making a full fist and fully opening the fist. Elevate your hand to reduce pain & swelling of the digits.  Ice over the operative site may be helpful to reduce pain & swelling.  DO NOT USE HEAT. Use Advil and Tylenol as we discussed, and you may also use some of the oxycodone prescribed in the emergency department if needed. Leave the dressing in place until you return to our office.  You may shower, but keep the bandage clean & dry.  You may drive a car when you are off of prescription pain medications and can safely control your vehicle with both hands.   Please call [phone] during normal business hours or [phone] after hours for any problems. Including the following:  - excessive redness of the incisions - drainage for more than 4 days - fever of more than 101.5 F  *Please note that pain medications will not be refilled after hours or on weekends.  WORK STATUS:  With the left hand, must keep the splint clean and dry, with no lifting, gripping, grasping greater than 5 pounds    Post Anesthesia Home Care Instructions  Activity: Get plenty of rest for the remainder of the day. A responsible individual must stay with you for 24 hours following the procedure.  For the next 24 hours, DO NOT: -Drive a car -Advertising copywriterperate machinery -Drink alcoholic beverages -Take any medication unless instructed by your physician -Make any legal decisions or sign important papers.  Meals: Start with liquid foods such as gelatin or soup. Progress to regular foods as tolerated. Avoid greasy, spicy, heavy foods. If nausea and/or vomiting occur, drink only clear liquids until the nausea and/or vomiting subsides. Call your physician if vomiting continues.  Special Instructions/Symptoms: Your throat may feel dry or sore from the anesthesia or the breathing tube placed in your  throat during surgery. If this causes discomfort, gargle with warm salt water. The discomfort should disappear within 24 hours.  If you had a scopolamine patch placed behind your ear for the management of post- operative nausea and/or vomiting:  1. The medication in the patch is effective for 72 hours, after which it should be removed.  Wrap patch in a tissue and discard in the trash. Wash hands thoroughly with soap and water. 2. You may remove the patch earlier than 72 hours if you experience unpleasant side effects which may include dry mouth, dizziness or visual disturbances. 3. Avoid touching the patch. Wash your hands with soap and water after contact with the patch.

## 2017-06-25 NOTE — Op Note (Signed)
06/25/2017  9:40 AM  PATIENT:  Guy Clark  21 y.o. male  PRE-OPERATIVE DIAGNOSIS:  Displaced left fourth metacarpal shaft fracture  POST-OPERATIVE DIAGNOSIS:  Same  PROCEDURE:  CRPP left fourth metacarpal shaft fracture  SURGEON: Cliffton Astersavid A. Janee Mornhompson, MD  PHYSICIAN ASSISTANT: None  ANESTHESIA:  general  SPECIMENS:  None  DRAINS:   None  EBL:  less than 50 mL  PREOPERATIVE INDICATIONS:  Guy Clark is a  21 y.o. male with displaced angulated left fourth metacarpal shaft fracture  The risks benefits and alternatives were discussed with the patient preoperatively including but not limited to the risks of infection, bleeding, nerve injury, cardiopulmonary complications, the need for revision surgery, among others, and the patient verbalized understanding and consented to proceed.  OPERATIVE IMPLANTS: 0.062 inch K wire placed intramedullary.  OPERATIVE PROCEDURE:  After receiving prophylactic antibiotics, the patient was escorted to the operative theatre and placed in a supine position.  A surgical "time-out" was performed during which the planned procedure, proposed operative site, and the correct patient identity were compared to the operative consent and agreement confirmed by the circulating nurse according to current facility policy.  Following application of a tourniquet to the operative extremity, the exposed skin was prepped with Chloraprep and draped in the usual sterile fashion.  The tourniquet was not inflated  A small incision was marked fluoroscopically at the base of the fourth metacarpal, skin incision made, followed by spreading dissection down to the dorsal base of the metacarpal.  An entry hole was created with a 0.062 inch K wire.  This was removed, a sharp and clipped off, and the tip bent into a slight skid shape.  It was then inserted by hand to the fracture site where the fracture was reduced to manipulation and the K wire driven by hand across the canal into the  distal fragment.  It was advanced sufficiently into the distal fragment, into the head of the metacarpal, and impaction was used to help compress the fractured fracture site.  Proximally the K wire was bent over 90/90 and clipped.  Percent Marcaine was instilled about the pin site for postoperative pain control.  A short arm splint dressing was applied with a volar plaster component, and he was  awakened and taken to the recovery room stable condition, breathing spontaneously  DISPOSITION: He'll be discharged home today with typical instructions, returning in 7-10 days for reevaluation, with new x-rays of the left hand out of splint and conversion to short arm cast.

## 2017-06-25 NOTE — Transfer of Care (Signed)
Immediate Anesthesia Transfer of Care Note  Patient: Guy SearBennie D Crist  Procedure(s) Performed: Procedure(s): PINNING OF LEFT 4TH METACARPAL FRACTURE (Left)  Patient Location: PACU  Anesthesia Type:General  Level of Consciousness: awake and patient cooperative  Airway & Oxygen Therapy: Patient Spontanous Breathing and Patient connected to face mask oxygen  Post-op Assessment: Report given to RN and Post -op Vital signs reviewed and stable  Post vital signs: Reviewed and stable  Last Vitals:  Vitals:   06/25/17 0907 06/25/17 1155  BP: 138/67   Pulse: 70 (!) 101  Resp: 20 (!) 34  Temp: 37.1 C     Last Pain:  Vitals:   06/25/17 0907  TempSrc: Oral  PainSc:          Complications: No apparent anesthesia complications

## 2017-06-25 NOTE — Anesthesia Preprocedure Evaluation (Signed)
Anesthesia Evaluation  Patient identified by MRN, date of birth, ID band Patient awake    Reviewed: Allergy & Precautions, H&P , NPO status , Patient's Chart, lab work & pertinent test results  Airway Mallampati: II   Neck ROM: full    Dental   Pulmonary former smoker,    breath sounds clear to auscultation       Cardiovascular negative cardio ROS   Rhythm:regular Rate:Normal     Neuro/Psych    GI/Hepatic   Endo/Other    Renal/GU      Musculoskeletal   Abdominal   Peds  Hematology   Anesthesia Other Findings   Reproductive/Obstetrics                             Anesthesia Physical Anesthesia Plan  ASA: I  Anesthesia Plan: General   Post-op Pain Management:    Induction: Intravenous  PONV Risk Score and Plan: 3 and Ondansetron, Dexamethasone, Propofol, Midazolam and Treatment may vary due to age or medical condition  Airway Management Planned: LMA  Additional Equipment:   Intra-op Plan:   Post-operative Plan:   Informed Consent: I have reviewed the patients History and Physical, chart, labs and discussed the procedure including the risks, benefits and alternatives for the proposed anesthesia with the patient or authorized representative who has indicated his/her understanding and acceptance.     Plan Discussed with: CRNA, Anesthesiologist and Surgeon  Anesthesia Plan Comments:         Anesthesia Quick Evaluation

## 2017-06-25 NOTE — H&P (Signed)
Guy Clark is an 21 y.o. male.   Chief Complaint: left hand fx HPI: This patient presented with a left hand injury.  ED w/u revealed L 4 MC fx. Patient failed to keep office appt last week, but presents today for operative reduction/stabilization.  Past Medical History:  Diagnosis Date  . Metacarpal bone fracture 06/17/2017   left 4th    History reviewed. No pertinent surgical history.  History reviewed. No pertinent family history. Social History:  reports that he quit smoking about 9 years ago. He has never used smokeless tobacco. He reports that he does not drink alcohol or use drugs.  Allergies: No Known Allergies  Medications Prior to Admission  Medication Sig Dispense Refill  . oxyCODONE-acetaminophen (PERCOCET/ROXICET) 5-325 MG tablet Take 1-2 tablets by mouth every 6 (six) hours as needed for severe pain. 15 tablet 0    No results found for this or any previous visit (from the past 48 hour(s)). No results found.  Review of Systems  All other systems reviewed and are negative.   Blood pressure 138/67, pulse 70, temperature 98.8 F (37.1 C), temperature source Oral, resp. rate 20, height 5\' 10"  (1.778 m), weight 101.2 kg (223 lb), SpO2 100 %. Physical Exam  Constitutional: He is oriented to person, place, and time. He appears well-developed and well-nourished.  HENT:  Head: Normocephalic and atraumatic.  Eyes: Pupils are equal, round, and reactive to light.  Neck: Normal range of motion. Neck supple.  Cardiovascular: Intact distal pulses.   Respiratory: Effort normal.  GI: Soft.  Musculoskeletal:  Left hand splint in place, TTP dorsal 4MC with obvious palpable deformity, mild malrotation, NVI  Neurological: He is alert and oriented to person, place, and time.  Skin: Skin is warm and dry.  Psychiatric: He has a normal mood and affect. His behavior is normal. Judgment and thought content normal.     Assessment/Plan L 4 MC shaft fx  I discussed these findings  with him and the rationale for operative reduction and stabilization, hopefully with an intramedullary K wire, but with open plating if necessary.  Questions were invited and answered.  Goals, risks, and options were reviewed and informed consent obtained, document executed.  We will plan for him to follow-up in roughly a week, at which time he will need new x-rays of the left hand out of splint and likely conversion to short arm cast.  Guy Clark A., MD 06/25/2017, 9:32 AM

## 2017-06-25 NOTE — Anesthesia Postprocedure Evaluation (Signed)
Anesthesia Post Note  Patient: Guy SearBennie D Clark  Procedure(s) Performed: Procedure(s) (LRB): PINNING OF LEFT 4TH METACARPAL FRACTURE (Left)     Patient location during evaluation: PACU Anesthesia Type: General Level of consciousness: awake and alert Pain management: pain level controlled Vital Signs Assessment: post-procedure vital signs reviewed and stable Respiratory status: spontaneous breathing, nonlabored ventilation, respiratory function stable and patient connected to nasal cannula oxygen Cardiovascular status: blood pressure returned to baseline and stable Postop Assessment: no signs of nausea or vomiting Anesthetic complications: no    Last Vitals:  Vitals:   06/25/17 1245 06/25/17 1300  BP: 128/77 130/81  Pulse: 84 61  Resp: (!) 22 13  Temp:      Last Pain:  Vitals:   06/25/17 1300  TempSrc:   PainSc: 3                  Jahquez Steffler

## 2017-06-26 ENCOUNTER — Encounter (HOSPITAL_BASED_OUTPATIENT_CLINIC_OR_DEPARTMENT_OTHER): Payer: Self-pay | Admitting: Orthopedic Surgery

## 2018-02-15 ENCOUNTER — Other Ambulatory Visit: Payer: Self-pay

## 2018-02-15 ENCOUNTER — Emergency Department (HOSPITAL_COMMUNITY): Payer: No Typology Code available for payment source

## 2018-02-15 ENCOUNTER — Encounter (HOSPITAL_COMMUNITY): Payer: Self-pay

## 2018-02-15 ENCOUNTER — Emergency Department (HOSPITAL_COMMUNITY)
Admission: EM | Admit: 2018-02-15 | Discharge: 2018-02-15 | Disposition: A | Discharge status: Home / Self Care | Payer: No Typology Code available for payment source | Attending: Emergency Medicine | Admitting: Emergency Medicine

## 2018-02-15 DIAGNOSIS — J111 Influenza due to unidentified influenza virus with other respiratory manifestations: Secondary | ICD-10-CM | POA: Insufficient documentation

## 2018-02-15 DIAGNOSIS — Z87891 Personal history of nicotine dependence: Secondary | ICD-10-CM | POA: Insufficient documentation

## 2018-02-15 DIAGNOSIS — R69 Illness, unspecified: Secondary | ICD-10-CM

## 2018-02-15 LAB — COMPREHENSIVE METABOLIC PANEL
ALBUMIN: 3.7 g/dL (ref 3.5–5.0)
ALK PHOS: 76 U/L (ref 38–126)
ALT: 34 U/L (ref 17–63)
AST: 25 U/L (ref 15–41)
Anion gap: 8 (ref 5–15)
BILIRUBIN TOTAL: 0.6 mg/dL (ref 0.3–1.2)
BUN: 8 mg/dL (ref 6–20)
CALCIUM: 8.7 mg/dL — AB (ref 8.9–10.3)
CO2: 24 mmol/L (ref 22–32)
Chloride: 106 mmol/L (ref 101–111)
Creatinine, Ser: 1.13 mg/dL (ref 0.61–1.24)
GFR calc Af Amer: >60 mL/min (ref >60)
GFR calc non Af Amer: >60 mL/min (ref >60)
GLUCOSE: 90 mg/dL (ref 65–99)
POTASSIUM: 3.7 mmol/L (ref 3.5–5.1)
Sodium: 138 mmol/L (ref 135–145)
TOTAL PROTEIN: 7.1 g/dL (ref 6.5–8.1)

## 2018-02-15 LAB — CBC WITH DIFFERENTIAL/PLATELET
BASOS ABS: 0 10*3/uL (ref 0.0–0.1)
Basophils Relative: 1 %
EOS PCT: 1 %
Eosinophils Absolute: 0 10*3/uL (ref 0.0–0.7)
HCT: 45.1 % (ref 39.0–52.0)
Hemoglobin: 14.7 g/dL (ref 13.0–17.0)
LYMPHS PCT: 25 %
Lymphs Abs: 1.6 10*3/uL (ref 0.7–4.0)
MCH: 28.1 pg (ref 26.0–34.0)
MCHC: 32.6 g/dL (ref 30.0–36.0)
MCV: 86.2 fL (ref 78.0–100.0)
MONO ABS: 0.8 10*3/uL (ref 0.1–1.0)
Monocytes Relative: 12 %
Neutro Abs: 4 10*3/uL (ref 1.7–7.7)
Neutrophils Relative %: 61 %
PLATELETS: 265 10*3/uL (ref 150–400)
RBC: 5.23 MIL/uL (ref 4.22–5.81)
RDW: 13.5 % (ref 11.5–15.5)
WBC: 6.4 10*3/uL (ref 4.0–10.5)

## 2018-02-15 LAB — I-STAT CG4 LACTIC ACID, ED: Lactic Acid, Venous: 0.88 mmol/L (ref 0.5–1.9)

## 2018-02-15 MED ORDER — ACETAMINOPHEN 325 MG PO TABS
650.0000 mg | ORAL_TABLET | Freq: Once | ORAL | Status: AC | PRN
  Administered 2018-02-15: 650 mg via ORAL
  Filled 2018-02-15: qty 2

## 2018-02-15 NOTE — Discharge Instructions (Signed)
Your symptoms are most consistent with a viral upper respiratory infection, possibly influenza. Your labs and chest x-ray were normal.   The main treatment approach for a viral upper respiratory infection and/or influenza is to treat the symptoms, support your immune system and prevent spread of illness. Stay well-hydrated. Rest. Take ibuprofen or Tylenol around the clock to help with associated fevers, sore throat, headaches, generalized body aches and malaise. Use an over-the-counter nasal steroid spray (like Flonase) to help with nasal congestion, runny nose and postnasal drip.  Wash your hands often to prevent spread.  A viral upper respiratory infection and/or influenza typically lasts 5-7 days.  Symptoms resolve slowly.  However, a viral upper respiratory infection can also worsen and progress into pneumonia.  Monitor your symptoms. If your symptoms worsen, persist and you develop persistent fevers, chest pain, productive cough you should follow up with your primary care provider.

## 2018-02-15 NOTE — ED Provider Notes (Signed)
[facility] EMERGENCY DEPARTMENT Provider Note   CSN: [id number] Arrival date & time: 02/15/18  1531     History   Chief Complaint Chief Complaint  Patient presents with  . Influenza    HPI Guy Clark is a 22 y.o. male w/o pmh here for evaluation of "flu like symptoms" since yesterday. Reports nasal congestion, rhinorrhea, cough with yellow sputum, headache, body aches, nausea, vomiting x 2, diarrhea and fevers. Actually feels better and just finished eating a bag of potato chips. No known sick contacts. No ho immunocompromise. Able to keep fluids and food down.  Denies Cp, SOB, headache, neck stiffness, abdominal pain, urinary symptoms.  HPI  Past Medical History:  Diagnosis Date  . Metacarpal bone fracture 06/17/2017   left 4th    There are no active problems to display for this patient.   Past Surgical History:  Procedure Laterality Date  . CLOSED REDUCTION METACARPAL WITH PERCUTANEOUS PINNING Left 06/25/2017   Procedure: PINNING OF LEFT 4TH METACARPAL FRACTURE;  Surgeon: Mack Hook, MD;  Location: [facility];  Service: Orthopedics;  Laterality: Left;       Home Medications    Prior to Admission medications   Medication Sig Start Date End Date Taking? Authorizing Provider  acetaminophen (TYLENOL) 325 MG tablet Take 2 tablets (650 mg total) by mouth every 6 (six) hours as needed for mild pain or moderate pain. 06/25/17   Mack Hook, MD  ibuprofen (ADVIL) 200 MG tablet Take 3 tablets (600 mg total) by mouth every 6 (six) hours as needed for mild pain or moderate pain. 06/25/17   Mack Hook, MD  oxyCODONE-acetaminophen (PERCOCET/ROXICET) 5-325 MG tablet Take 1-2 tablets by mouth every 6 (six) hours as needed for severe pain. 06/18/17   Emi Holes, PA-C    Family History History reviewed. No pertinent family history.  Social History Social History   Tobacco Use  . Smoking status: Former Smoker    Last attempt to  quit: 12/18/2007    Years since quitting: 10.1  . Smokeless tobacco: Never Used  Substance Use Topics  . Alcohol use: No  . Drug use: No     Allergies   Patient has no known allergies.   Review of Systems Review of Systems  Constitutional: Positive for chills and fever.  HENT: Positive for congestion, postnasal drip and rhinorrhea.   Respiratory: Positive for cough.   Gastrointestinal: Positive for diarrhea, nausea and vomiting.  Musculoskeletal: Positive for myalgias.  All other systems reviewed and are negative.    Physical Exam Updated Vital Signs BP (!) 146/79 (BP Location: Right Arm)   Pulse 98   Temp (!) 102.3 F (39.1 C) (Oral)   Resp 16   Ht 5\' 10"  (1.778 m)   Wt 106.6 kg (235 lb)   SpO2 100%   BMI 33.72 kg/m   Physical Exam  Constitutional: He is oriented to person, place, and time. He appears well-developed and well-nourished. No distress.  NAD. Eating potato chips.   HENT:  Head: Normocephalic and atraumatic.  Right Ear: External ear normal.  Left Ear: External ear normal.  Nose: Nose normal.  Moderate mucosal edema with clear rhinorrhea. MMM. TMs, oropharynx and tonsils normal. No sinus TTP.   Eyes: Conjunctivae and EOM are normal. No scleral icterus.  Neck: Normal range of motion. Neck supple.  No cervical lymphadenopathy. Full PROM of neck without pain or rigidity.   Cardiovascular: Normal rate, regular rhythm, normal heart sounds and intact distal  pulses.  No murmur heard. Pulmonary/Chest: Effort normal and breath sounds normal. He has no wheezes.  Abdominal: Soft. There is no tenderness.  Musculoskeletal: Normal range of motion. He exhibits no deformity.  Neurological: He is alert and oriented to person, place, and time.  Skin: Skin is warm and dry. Capillary refill takes less than 2 seconds.  Psychiatric: He has a normal mood and affect. His behavior is normal. Judgment and thought content normal.  Nursing note and vitals reviewed.    ED  Treatments / Results  Labs (all labs ordered are listed, but only abnormal results are displayed) Labs Reviewed  COMPREHENSIVE METABOLIC PANEL - Abnormal; Notable for the following components:      Result Value   Calcium 8.7 (*)    All other components within normal limits  CBC WITH DIFFERENTIAL/PLATELET  I-STAT CG4 LACTIC ACID, ED  I-STAT CG4 LACTIC ACID, ED    EKG  EKG Interpretation None       Radiology Dg Chest 2 View  Result Date: 02/15/2018 CLINICAL DATA:  Flu like symptoms EXAM: CHEST  2 VIEW COMPARISON:  08/24/2014 FINDINGS: Mild bronchitic changes. No consolidation or effusion. Normal heart size. No pneumothorax. IMPRESSION: Mild bronchitic changes without focal pulmonary infiltrate Electronically Signed   By: Jasmine PangKim  Fujinaga M.D.   On: 02/15/2018 17:20    Procedures Procedures (including critical care time)  Medications Ordered in ED Medications  acetaminophen (TYLENOL) tablet 650 mg (650 mg Oral Given 02/15/18 1638)     Initial Impression / Assessment and Plan / ED Course  I have reviewed the triage vital signs and the nursing notes.  Pertinent labs & imaging results that were available during my care of the patient were reviewed by me and considered in my medical decision making (see chart for details).    22 y.o. -year-old male with no pmh presents with URI like symptoms  2 days. On my exam patient is nontoxic appearing, speaking in full sentences, w/o increased WOB. No tachypnea, tachycardia, hypoxia. Lungs are CTAB. Triage RN ordered labs and CXR which I reviewed and unremarkable. No significant h/o immunocompromise. Doubt pneumonia.  Given reassuring physical exam, will discharge with symptomatic treatment. Strict ED return precautions given. Patient is aware that a viral URI infection may precede pneumonia or worsening illness. Patient is aware of red flag symptoms to monitor for that would warrant return to the ED for further reevaluation.    Final Clinical  Impressions(s) / ED Diagnoses   Final diagnoses:  Influenza-like illness    ED Discharge Orders    None       Jerrell MylarGibbons, Juliahna Wiswell J, PA-C 02/15/18 1912    Terrilee FilesButler, Michael C, MD 02/16/18 1135

## 2018-02-15 NOTE — ED Triage Notes (Signed)
Pt endorses flu sx, productive cough with yellow sputum, bodyaches and chills since yesterday. Pt state that his coworker had the same. Febrile in triage 102.3 oral. All over VSS. No medical hx.

## 2018-11-04 IMAGING — CR DG HAND COMPLETE 3+V*L*
3 series · 3 of 3 positions shown · non-contrast
Comparison: None.

CLINICAL DATA: 21-year-old male boxing and felt pain. Initial
encounter.

EXAM:
LEFT HAND - COMPLETE 3+ VIEW

[x hand pa left]
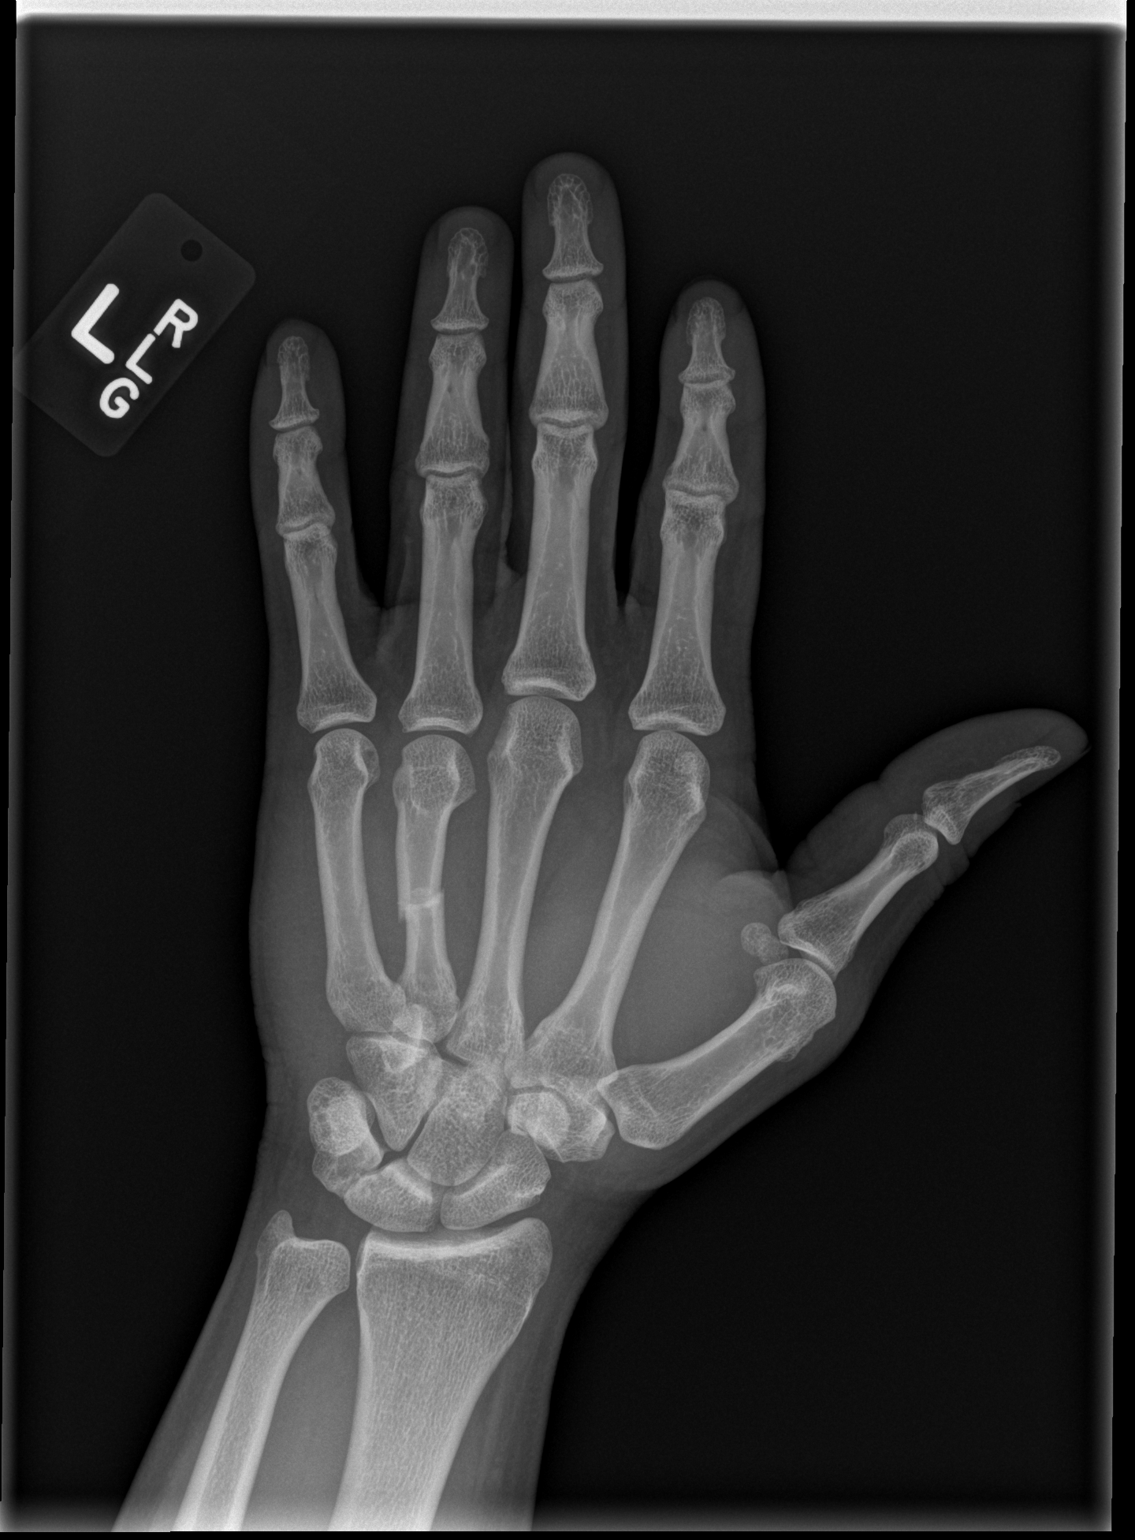

[x hand obl left]
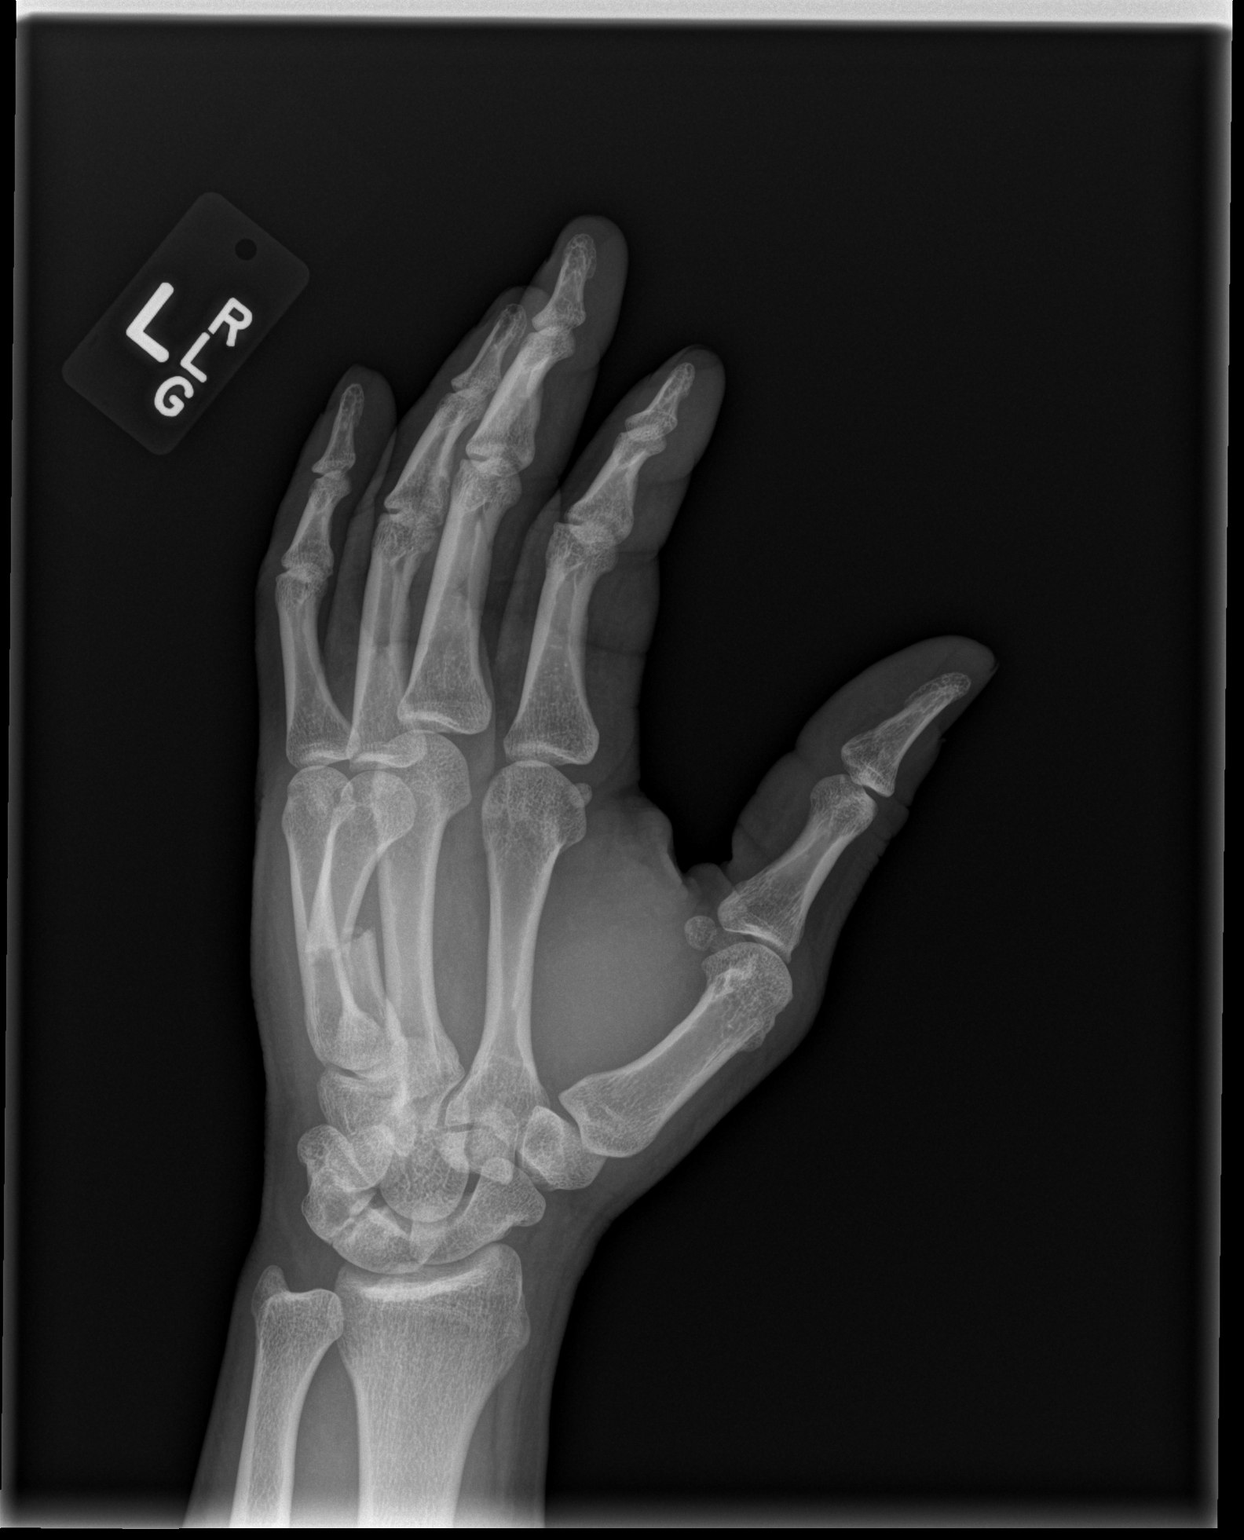

[x hand lat left]
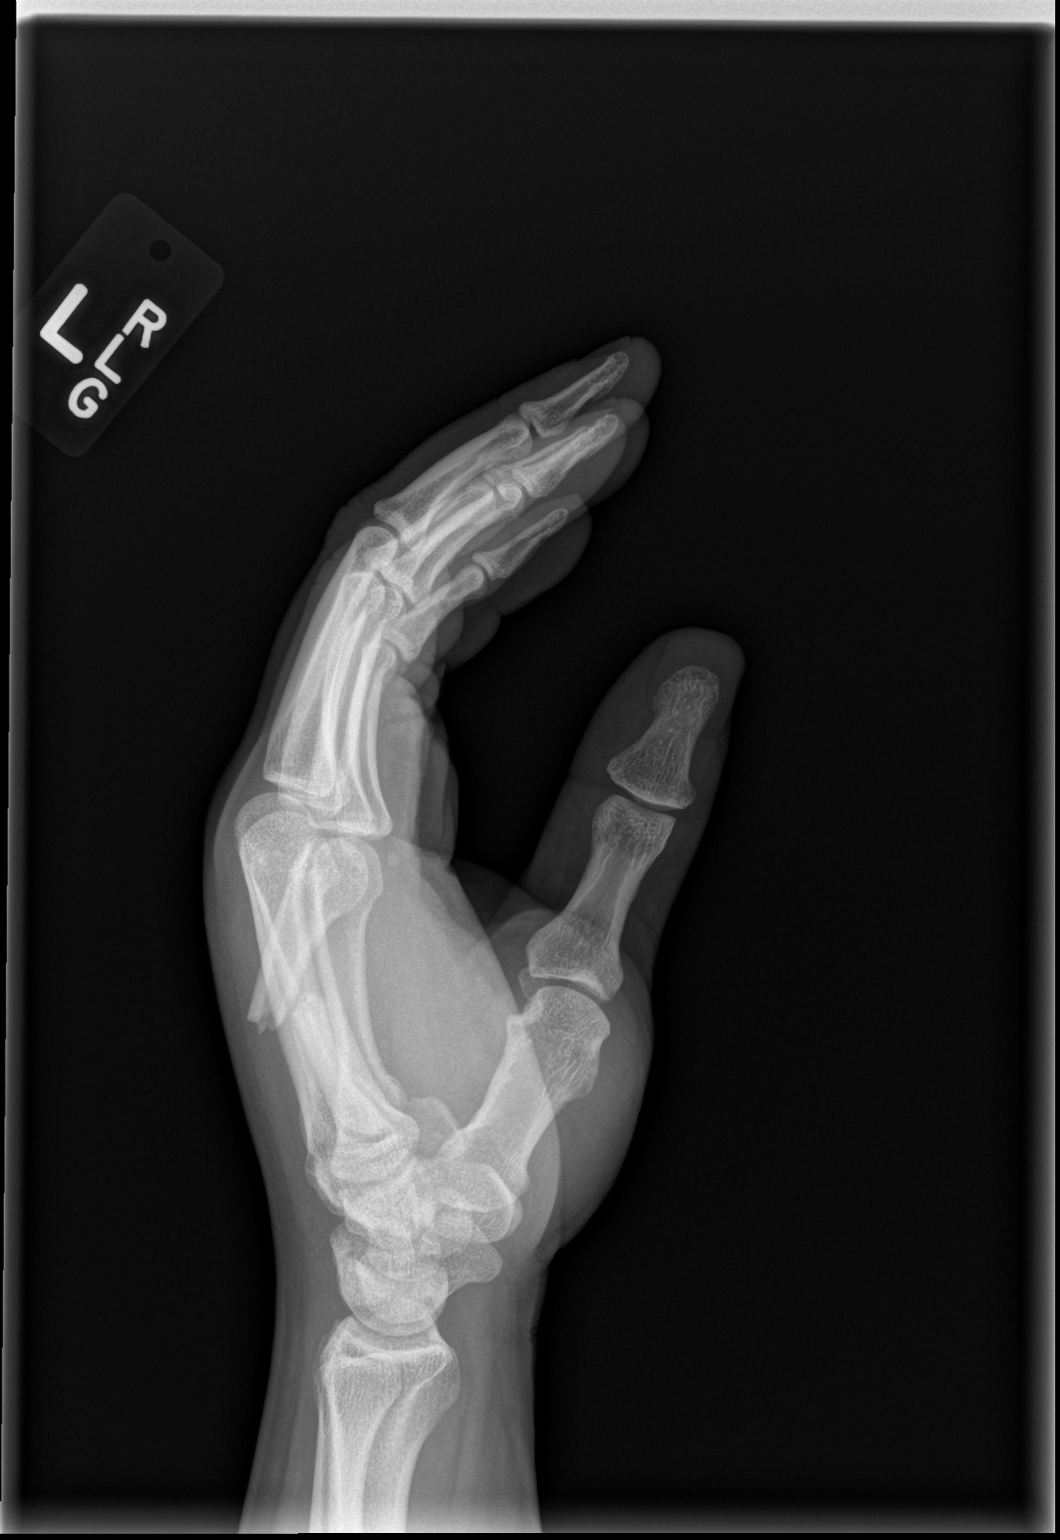

[3 of 3 positions shown; findings below may reference images not displayed]

FINDINGS: Transverse fracture of the proximal to mid aspect of the left fourth
metacarpal shaft with posterior angulation of the distal fracture
fragment and slight overlapping of fracture fragments.

No other fracture noted.

Question mild cystic changes of the lunate and triquetrum.
IMPRESSION: Transverse fracture of the proximal to mid aspect of the left fourth
metacarpal shaft with posterior angulation of the distal fracture
fragment and slight overlapping of fracture fragments.

## 2019-01-23 ENCOUNTER — Other Ambulatory Visit: Payer: Self-pay

## 2019-01-23 ENCOUNTER — Encounter (HOSPITAL_COMMUNITY): Payer: Self-pay | Admitting: Emergency Medicine

## 2019-01-23 ENCOUNTER — Emergency Department (HOSPITAL_COMMUNITY)
Admission: EM | Admit: 2019-01-23 | Discharge: 2019-01-23 | Disposition: A | Discharge status: Home / Self Care | Payer: Self-pay | Attending: Emergency Medicine | Admitting: Emergency Medicine

## 2019-01-23 DIAGNOSIS — Z87891 Personal history of nicotine dependence: Secondary | ICD-10-CM | POA: Insufficient documentation

## 2019-01-23 DIAGNOSIS — J111 Influenza due to unidentified influenza virus with other respiratory manifestations: Secondary | ICD-10-CM | POA: Insufficient documentation

## 2019-01-23 DIAGNOSIS — Z79899 Other long term (current) drug therapy: Secondary | ICD-10-CM | POA: Insufficient documentation

## 2019-01-23 DIAGNOSIS — R6889 Other general symptoms and signs: Secondary | ICD-10-CM

## 2019-01-23 MED ORDER — FLUTICASONE PROPIONATE 50 MCG/ACT NA SUSP: 2.0000 | Freq: Every day | NASAL | 0 refills | Status: DC

## 2019-01-23 MED ORDER — BENZONATATE 100 MG PO CAPS: 100.0000 mg | ORAL_CAPSULE | Freq: Three times a day (TID) | ORAL | 0 refills | Status: AC

## 2019-01-23 NOTE — ED Notes (Signed)
Pt verbalized understanding of discharge paperwork, prescriptions and follow-up care 

## 2019-01-23 NOTE — ED Provider Notes (Signed)
[facility] EMERGENCY DEPARTMENT Provider Note   CSN: [id number] Arrival date & time: 01/23/19  0301     History   Chief Complaint Chief Complaint  Patient presents with  . Chills    HPI Guy Clark is a 23 y.o. male.  HPI  Pt is a 23 y/o male who presents to the ED today c/o sweats, chills, body aches, congestion and cough. Cough, sore throat, and congestion started 3 days ago. Remainder of sxs started yesterday. He woke up this AM with decreased appetite and had one episode of vomiting. He has had no continued episodes of vomiting. No abd pain, diarrhea, constipation, or urinary sxs. No known fevers.   He did not get his flu shot this year.  Past Medical History:  Diagnosis Date  . Metacarpal bone fracture 06/17/2017   left 4th    There are no active problems to display for this patient.   Past Surgical History:  Procedure Laterality Date  . CLOSED REDUCTION METACARPAL WITH PERCUTANEOUS PINNING Left 06/25/2017   Procedure: PINNING OF LEFT 4TH METACARPAL FRACTURE;  Surgeon: Mack Hook, MD;  Location: [facility];  Service: Orthopedics;  Laterality: Left;        Home Medications    Prior to Admission medications   Medication Sig Start Date End Date Taking? Authorizing Provider  acetaminophen (TYLENOL) 325 MG tablet Take 2 tablets (650 mg total) by mouth every 6 (six) hours as needed for mild pain or moderate pain. 06/25/17   Mack Hook, MD  benzonatate (TESSALON) 100 MG capsule Take 1 capsule (100 mg total) by mouth every 8 (eight) hours for 5 days. 01/23/19 01/28/19  Joelyn Lover S, PA-C  fluticasone (FLONASE) 50 MCG/ACT nasal spray Place 2 sprays into both nostrils daily. 01/23/19   Marjory Meints S, PA-C  ibuprofen (ADVIL) 200 MG tablet Take 3 tablets (600 mg total) by mouth every 6 (six) hours as needed for mild pain or moderate pain. 06/25/17   Mack Hook, MD  oxyCODONE-acetaminophen (PERCOCET/ROXICET) 5-325 MG tablet  Take 1-2 tablets by mouth every 6 (six) hours as needed for severe pain. 06/18/17   Emi Holes, PA-C    Family History No family history on file.  Social History Social History   Tobacco Use  . Smoking status: Former Smoker    Last attempt to quit: 12/18/2007    Years since quitting: 11.1  . Smokeless tobacco: Never Used  Substance Use Topics  . Alcohol use: No  . Drug use: No     Allergies   Patient has no known allergies.   Review of Systems Review of Systems  Constitutional: Positive for chills and diaphoresis.  HENT: Positive for congestion and sore throat.   Eyes: Negative for visual disturbance.  Respiratory: Positive for cough. Negative for shortness of breath.   Cardiovascular: Negative for chest pain.  Gastrointestinal: Positive for nausea and vomiting. Negative for abdominal pain, blood in stool, constipation and diarrhea.  Genitourinary: Negative for flank pain.  Musculoskeletal: Positive for myalgias.  Skin: Negative for rash.  Neurological: Negative for headaches.   Physical Exam Updated Vital Signs BP 124/69   Pulse 63   Temp 97.9 F (36.6 C) (Oral)   Resp 16   Ht 5\' 10"  (1.778 m)   Wt 106.6 kg   SpO2 99%   BMI 33.72 kg/m   Physical Exam Vitals signs and nursing note reviewed.  Constitutional:      Appearance: He is well-developed.  HENT:  Head: Normocephalic and atraumatic.     Comments: Bilateral TMs normal.  Nasal turbinates swollen bilaterally.  Mild pharyngeal erythema.  Mild tonsillar edema, no evidence of PTA.  No tonsillar exudates noted.  Uvula midline.    Ears:     Comments: Bilateral TMs normal.  Nasal turbinates swollen bilaterally.  Mild pharyngeal erythema.  Mild tonsillar edema, no evidence of PTA.  No tonsillar exudates noted.  Uvula midline.    Mouth/Throat:     Mouth: Mucous membranes are moist.  Eyes:     Conjunctiva/sclera: Conjunctivae normal.     Pupils: Pupils are equal, round, and reactive to light.  Neck:      Musculoskeletal: Neck supple.  Cardiovascular:     Rate and Rhythm: Normal rate and regular rhythm.     Heart sounds: Normal heart sounds. No murmur.  Pulmonary:     Effort: Pulmonary effort is normal. No respiratory distress.     Breath sounds: Normal breath sounds. No stridor. No wheezing or rhonchi.  Abdominal:     General: Bowel sounds are normal.     Palpations: Abdomen is soft.     Tenderness: There is no abdominal tenderness.  Lymphadenopathy:     Cervical: Cervical adenopathy present.  Skin:    General: Skin is warm and dry.  Neurological:     Mental Status: He is alert.      ED Treatments / Results  Labs (all labs ordered are listed, but only abnormal results are displayed) Labs Reviewed - No data to display  EKG None  Radiology No results found.  Procedures Procedures (including critical care time)  Medications Ordered in ED Medications - No data to display   Initial Impression / Assessment and Plan / ED Course  I have reviewed the triage vital signs and the nursing notes.  Pertinent labs & imaging results that were available during my care of the patient were reviewed by me and considered in my medical decision making (see chart for details).     Final Clinical Impressions(s) / ED Diagnoses   Final diagnoses:  Flu-like symptoms   Patient with symptoms consistent with influenza.  Vitals are stable, no fever.  He had one episode of vomiting this morning and has not had any additional episodes.  Abdomen soft and nontender.  No signs of dehydration, tolerating PO's.  Lungs are clear. Due to patient's presentation and physical exam a chest x-ray was not ordered bc likely diagnosis of flu.  Discussed the cost versus benefit of Tamiflu treatment with the patient.  The patient understands that symptoms are greater than the recommended 24-48 hour window of treatment.  Patient will be discharged with instructions to orally hydrate, rest, and use over-the-counter  medications such as anti-inflammatories ibuprofen and Aleve for muscle aches and Tylenol for fever.  Patient will also be given a cough suppressant.    ED Discharge Orders         Ordered    fluticasone (FLONASE) 50 MCG/ACT nasal spray  Daily     01/23/19 1051    benzonatate (TESSALON) 100 MG capsule  Every 8 hours     01/23/19 1051           Sumer Moorehouse S, PA-C 01/23/19 1054    Charlynne Pander, MD 01/23/19 [phone]

## 2019-01-23 NOTE — ED Notes (Signed)
Pt tolerating juice and crackers.

## 2019-01-23 NOTE — Discharge Instructions (Signed)
You should follow up with your primary healthcare provider within the next 3-5 days for reevaluation. ° °You will need to return to the emergency department immediately if you experience any of the following symptoms: ° °Difficulty breathing or shortness or breath °Pain or pressure in the chest or abdomen °Sudden dizziness °Confusion °Severe or persistent vomiting °Flu-like symptoms that improve but then return with fever or worse cough ° °You should stay home for at least 24 hours after your fever is gone except to get medical care or other necessities. Your fever should be gone without the need to use a fever-reducing medicine, such as Tylenol or Motrin. Until then, you should stay home from work, school, travel, shopping, social events, and public gatherings. ° °Stay away from others as much as possible to keep from infecting them. If you must leave home, for example to get medical care, wear a facemask if you have one, or cover coughs and sneezes with a tissue. Wash your hands often to keep from spreading flu to others ° °

## 2019-01-23 NOTE — ED Triage Notes (Signed)
Pt arrives with reports of cold chills since yesterday, then woke up today with a HA. Reports N/V today. Endorses a cough x2 day.

## 2019-07-04 IMAGING — CR DG CHEST 2V
2 series · 2 of 2 positions shown · non-contrast
Comparison: 08/24/2014

CLINICAL DATA: Flu like symptoms

EXAM:
CHEST  2 VIEW

[chest pa]
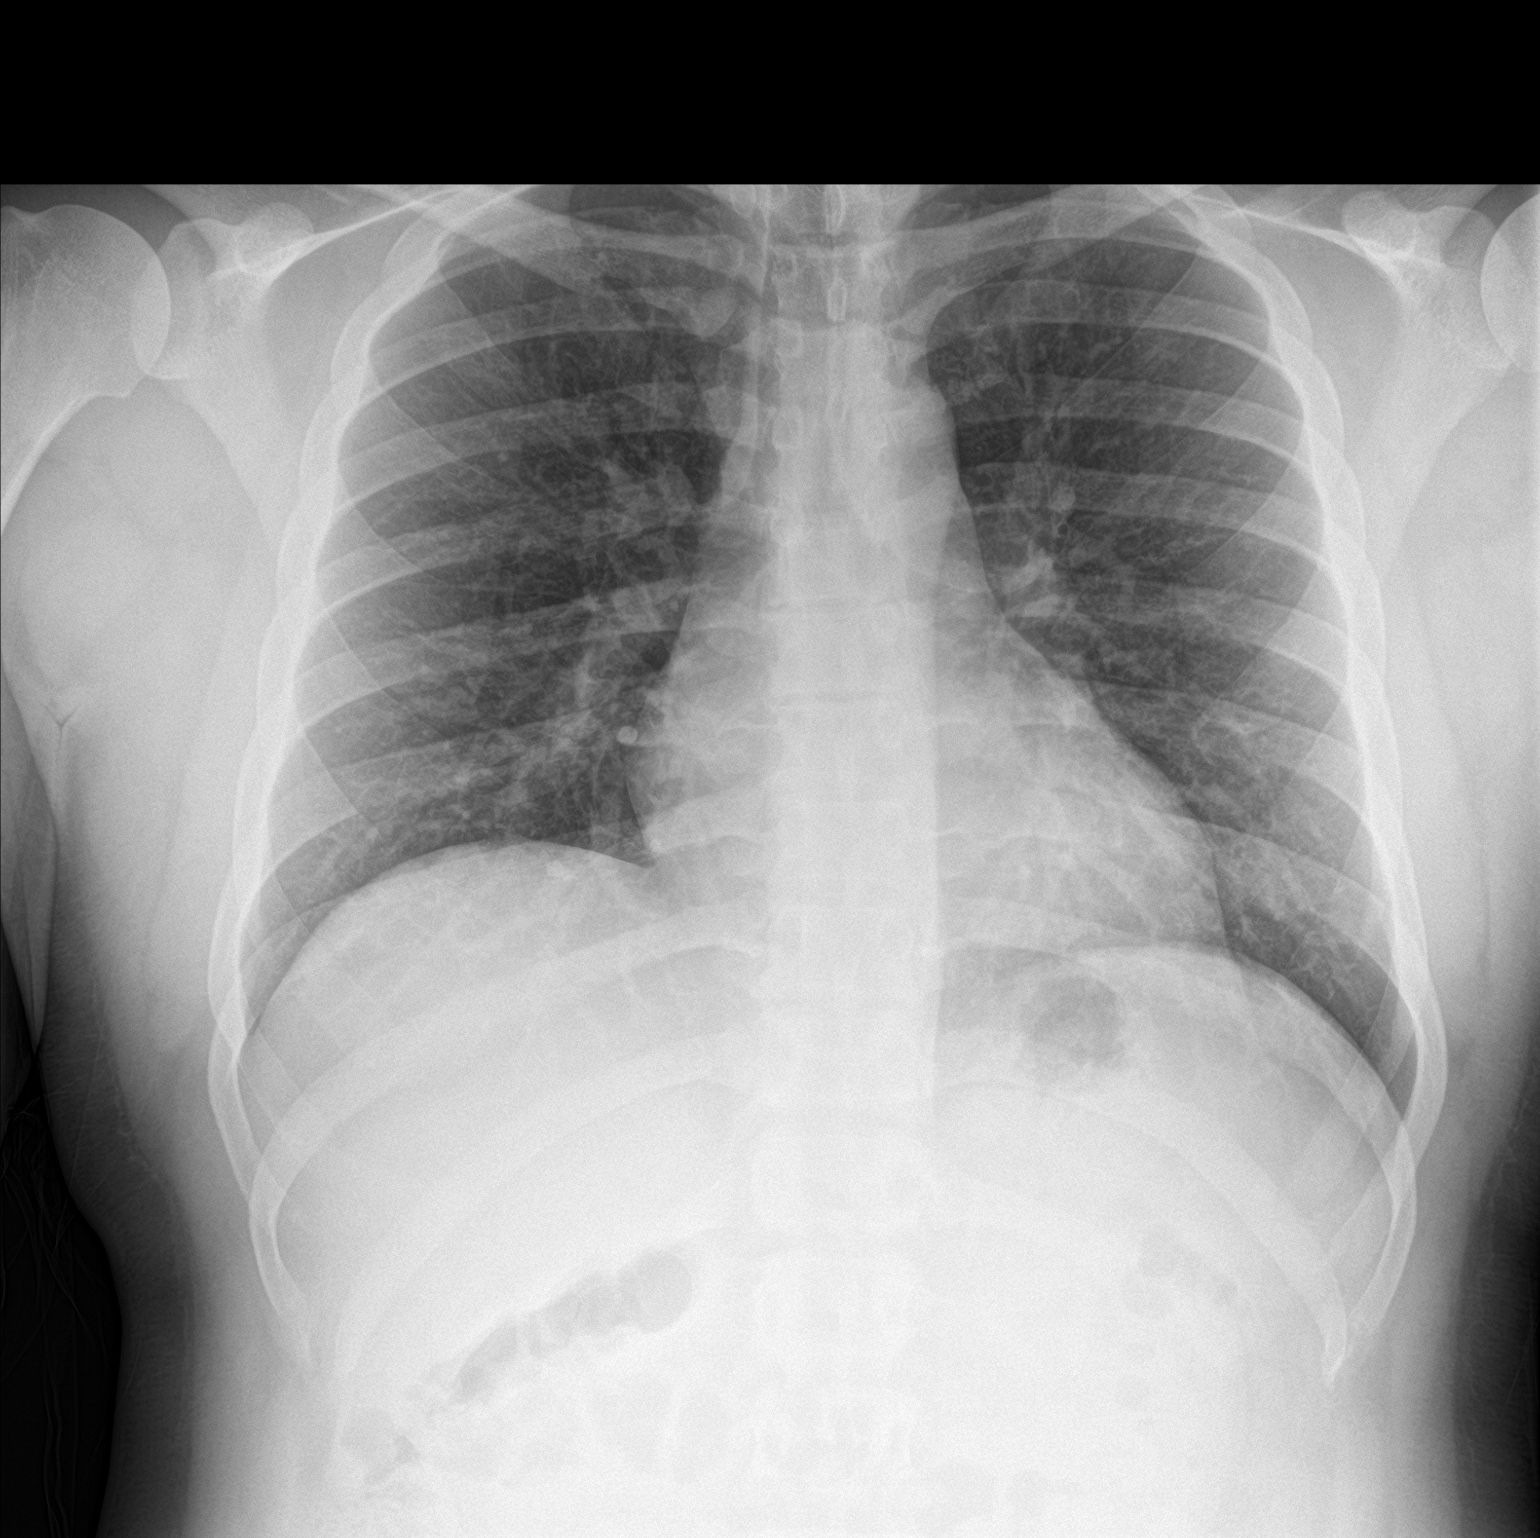

[chest lat]
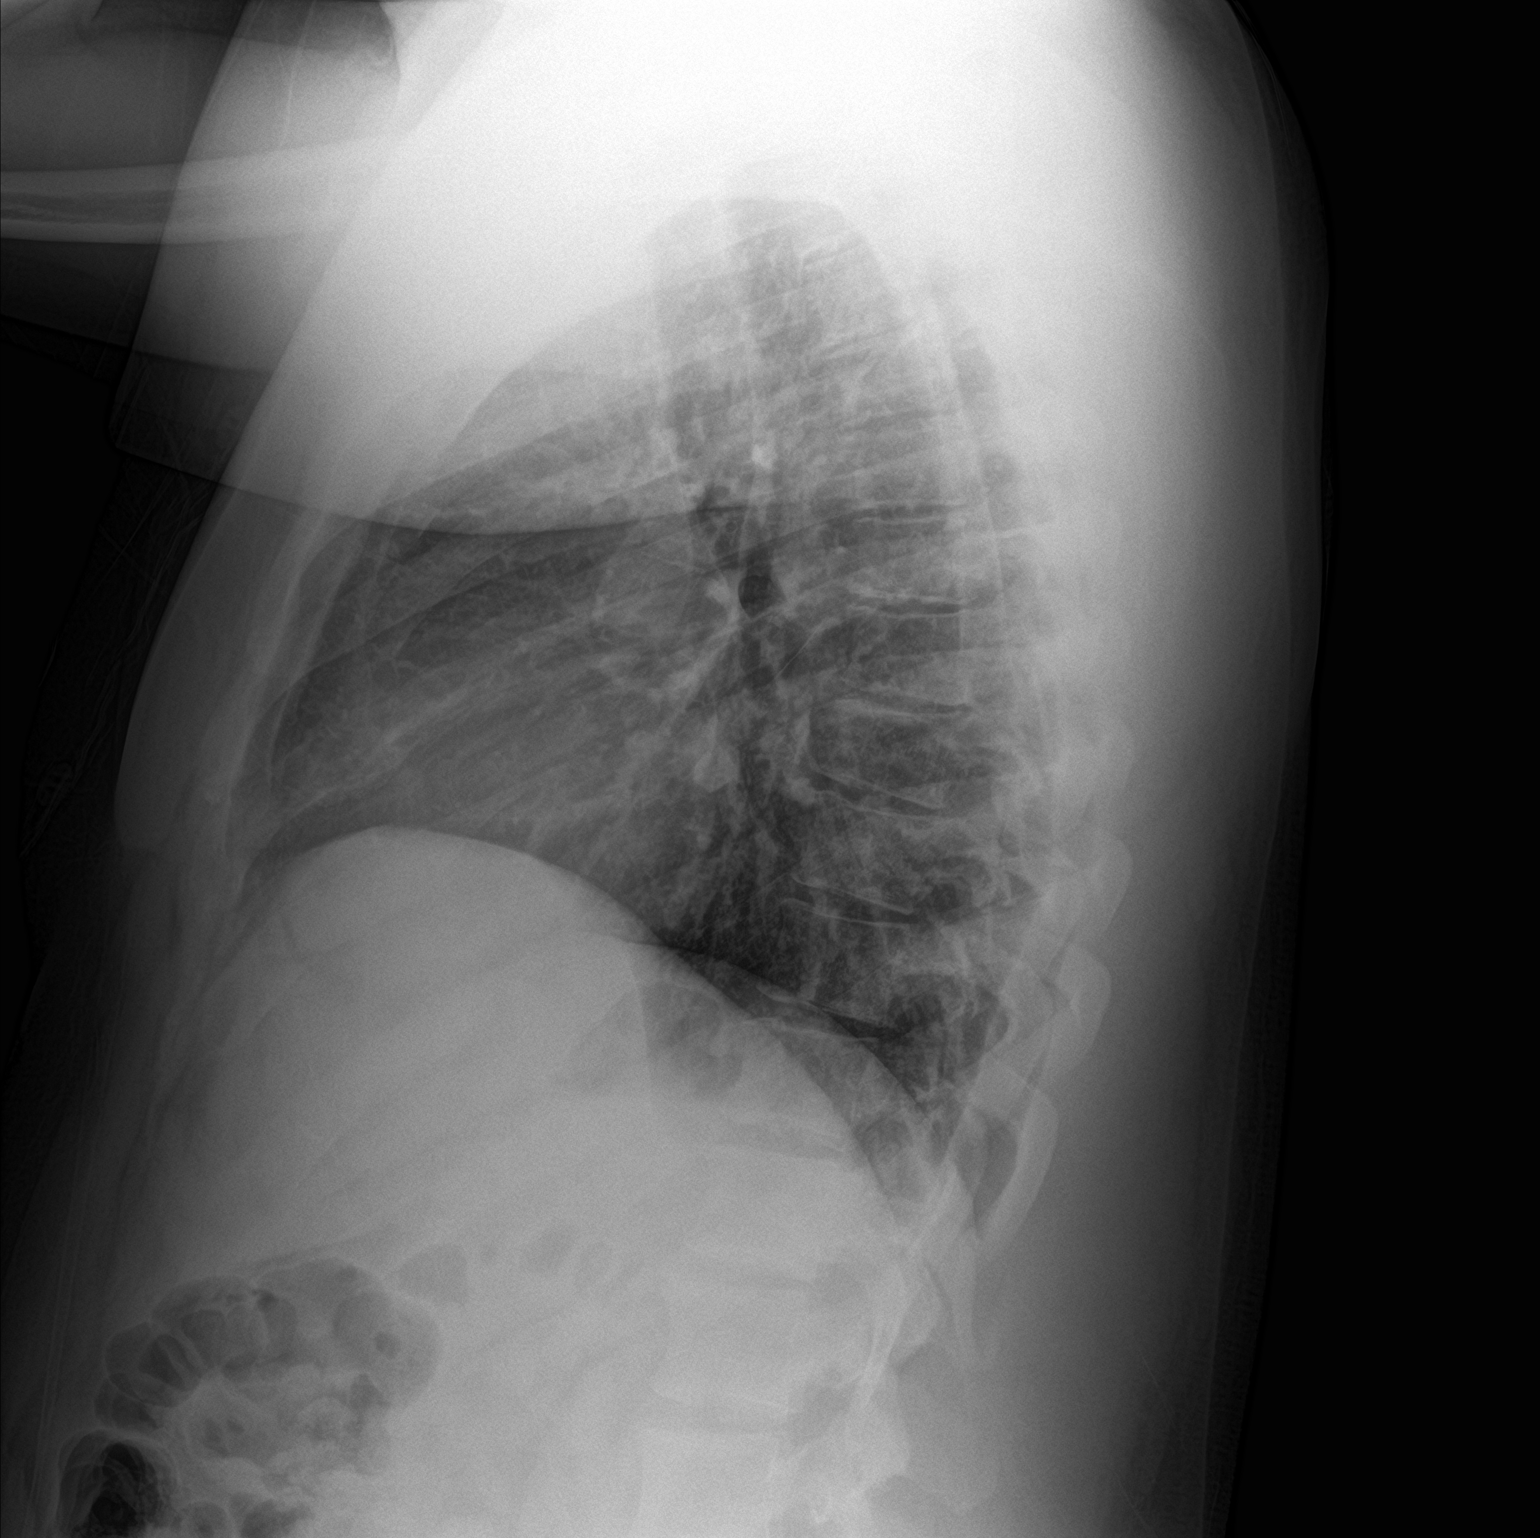

[2 of 2 positions shown; findings below may reference images not displayed]

FINDINGS: Mild bronchitic changes. No consolidation or effusion. Normal heart
size. No pneumothorax.
IMPRESSION: Mild bronchitic changes without focal pulmonary infiltrate

## 2022-10-18 ENCOUNTER — Other Ambulatory Visit: Payer: Self-pay

## 2022-10-18 ENCOUNTER — Encounter (HOSPITAL_BASED_OUTPATIENT_CLINIC_OR_DEPARTMENT_OTHER): Payer: Self-pay | Admitting: Emergency Medicine

## 2022-10-18 DIAGNOSIS — J020 Streptococcal pharyngitis: Secondary | ICD-10-CM | POA: Insufficient documentation

## 2022-10-18 DIAGNOSIS — Z20822 Contact with and (suspected) exposure to covid-19: Secondary | ICD-10-CM | POA: Insufficient documentation

## 2022-10-18 LAB — RESP PANEL BY RT-PCR (FLU A&B, COVID) ARPGX2
Influenza A by PCR: NEGATIVE
Influenza B by PCR: NEGATIVE
SARS Coronavirus 2 by RT PCR: NEGATIVE

## 2022-10-18 LAB — GROUP A STREP BY PCR: Group A Strep by PCR: DETECTED — AB

## 2022-10-18 NOTE — ED Triage Notes (Signed)
Reports swelling/pain in throat x 3 days. Minimal relief after home treatment/meds. Runny nose, cough

## 2022-10-19 ENCOUNTER — Emergency Department (HOSPITAL_BASED_OUTPATIENT_CLINIC_OR_DEPARTMENT_OTHER)
Admission: EM | Admit: 2022-10-19 | Discharge: 2022-10-19 | Disposition: A | Discharge status: Home / Self Care | Payer: Self-pay | Attending: Emergency Medicine | Admitting: Emergency Medicine

## 2022-10-19 DIAGNOSIS — J020 Streptococcal pharyngitis: Secondary | ICD-10-CM

## 2022-10-19 MED ORDER — PENICILLIN G BENZATHINE 1200000 UNIT/2ML IM SUSY
1.2000 10*6.[IU] | PREFILLED_SYRINGE | Freq: Once | INTRAMUSCULAR | Status: AC
Start: 2022-10-19 — End: 2022-10-19
  Administered 2022-10-19: 1.2 10*6.[IU] via INTRAMUSCULAR
  Filled 2022-10-19: qty 2

## 2022-10-19 MED ORDER — IBUPROFEN 400 MG PO TABS
600.0000 mg | ORAL_TABLET | Freq: Once | ORAL | Status: AC
  Administered 2022-10-19: 600 mg via ORAL
  Filled 2022-10-19: qty 1

## 2022-10-19 NOTE — ED Provider Notes (Signed)
[facility] EMERGENCY DEPT  Provider Note  CSN: [id number] Arrival date & time: 10/18/22 2109  History Chief Complaint  Patient presents with   Sore Throat    Guy Clark is a 26 y.o. male with no PMH reports 2-3 days of worsening sore throat, fever but no cough or rhinorrhea. No history of same. Pain worse with swallowing.    Home Medications Prior to Admission medications   Medication Sig Start Date End Date Taking? Authorizing Provider  acetaminophen (TYLENOL) 325 MG tablet Take 2 tablets (650 mg total) by mouth every 6 (six) hours as needed for mild pain or moderate pain. 06/25/17   Mack Hook, MD  fluticasone [facility]) 50 MCG/ACT nasal spray Place 2 sprays into both nostrils daily. 01/23/19   Couture, Cortni S, PA-C  ibuprofen (ADVIL) 200 MG tablet Take 3 tablets (600 mg total) by mouth every 6 (six) hours as needed for mild pain or moderate pain. 06/25/17   Mack Hook, MD  oxyCODONE-acetaminophen (PERCOCET/ROXICET) 5-325 MG tablet Take 1-2 tablets by mouth every 6 (six) hours as needed for severe pain. 06/18/17   Emi Holes, PA-C     Allergies    Patient has no known allergies.   Review of Systems   Review of Systems Please see HPI for pertinent positives and negatives  Physical Exam BP (!) 148/89 (BP Location: Right Arm)   Pulse 94   Temp 99.1 F (37.3 C) (Oral)   Resp 19   SpO2 98%   Physical Exam Vitals and nursing note reviewed.  HENT:     Head: Normocephalic.     Nose: Nose normal.     Mouth/Throat:     Pharynx: Uvula midline. Posterior oropharyngeal erythema present.     Tonsils: Tonsillar exudate present. 2+ on the right. 2+ on the left.  Eyes:     Extraocular Movements: Extraocular movements intact.  Pulmonary:     Effort: Pulmonary effort is normal.  Musculoskeletal:        General: Normal range of motion.     Cervical back: Neck supple.  Lymphadenopathy:     Cervical: Cervical adenopathy present.  Skin:    Findings:  No rash (on exposed skin).  Neurological:     Mental Status: He is alert and oriented to person, place, and time.  Psychiatric:        Mood and Affect: Mood normal.     ED Results / Procedures / Treatments   EKG None  Procedures Procedures  Medications Ordered in the ED Medications  penicillin g benzathine (BICILLIN LA) 1200000 UNIT/2ML injection 1.2 Million Units (has no administration in time range)  ibuprofen (ADVIL) tablet 600 mg (has no administration in time range)    Initial Impression and Plan  Patient here with sore throat, reported fever at home. Vitals here are reassuring. Strep test is positive. Will treat with PCN, recommend NSAIDs for pain, other OTC meds as needed for symptom control. PCP follow up. RTED if not improving. No signs of abscess on exam today.   ED Course       MDM Rules/Calculators/A&P Medical Decision Making Problems Addressed: Strep pharyngitis: acute illness or injury  Amount and/or Complexity of Data Reviewed Labs: ordered. Decision-making details documented in ED Course.  Risk Prescription drug management.    Final Clinical Impression(s) / ED Diagnoses Final diagnoses:  Strep pharyngitis    Rx / DC Orders ED Discharge Orders     None        Bernette Mayers,  Mercie Eon, MD 10/19/22 [phone]

## 2022-10-19 NOTE — ED Notes (Signed)
ED Provider at bedside. 

## 2022-10-19 NOTE — ED Notes (Signed)
Pt denies any complaints other than sore throat and difficulty swallowing- pt agreeable with d/c plan as discussed by provider- this nurse has verbally reinforced d/c instructions and provided pt with written copy - pt acknowledges verbal understanding and denies any addl questions concerns needs- pt ambulatory independently at d/c with steady gait; vitals stable; no distress

## 2023-06-09 ENCOUNTER — Emergency Department (HOSPITAL_COMMUNITY)
Admission: EM | Admit: 2023-06-09 | Discharge: 2023-06-10 | Disposition: A | Discharge status: Home / Self Care | Payer: Self-pay | Attending: Emergency Medicine | Admitting: Emergency Medicine

## 2023-06-09 ENCOUNTER — Other Ambulatory Visit: Payer: Self-pay

## 2023-06-09 ENCOUNTER — Emergency Department (HOSPITAL_COMMUNITY): Payer: Self-pay

## 2023-06-09 ENCOUNTER — Encounter (HOSPITAL_COMMUNITY): Payer: Self-pay

## 2023-06-09 DIAGNOSIS — Y9367 Activity, basketball: Secondary | ICD-10-CM | POA: Insufficient documentation

## 2023-06-09 DIAGNOSIS — X58XXXA Exposure to other specified factors, initial encounter: Secondary | ICD-10-CM | POA: Insufficient documentation

## 2023-06-09 DIAGNOSIS — S76312A Strain of muscle, fascia and tendon of the posterior muscle group at thigh level, left thigh, initial encounter: Secondary | ICD-10-CM | POA: Insufficient documentation

## 2023-06-09 NOTE — ED Triage Notes (Signed)
Complaining of left leg injury a couple of months ago while playing basketball. Said that behind his knee hurts, he thought it would go away but has not. Did not see anyone when the injury happened.

## 2023-06-10 ENCOUNTER — Emergency Department (HOSPITAL_COMMUNITY): Payer: Self-pay

## 2023-06-10 MED ORDER — DICLOFENAC SODIUM 50 MG PO TBEC: 50.0000 mg | DELAYED_RELEASE_TABLET | Freq: Two times a day (BID) | ORAL | 0 refills | Status: AC

## 2023-06-10 NOTE — ED Provider Notes (Signed)
[name] EMERGENCY DEPARTMENT AT [facility] Provider Note   CSN: [id number] Arrival date & time: 06/09/23  2205     History  Chief Complaint  Patient presents with   Leg Pain    Guy Clark is a 27 y.o. male.  27 year old male presents with complaint of left posterior thigh pain onset November.  Patient states that he was playing basketball, jumped and had sudden onset of pain behind his left knee that radiates up towards his buttocks.  Pain is not improving with time.  Has not trialed NSAIDs.  Has not been seen for this previously.  States that he has had a prior left knee injury from basketball that also resolved on its own without evaluation or intervention.  No other complaints or concerns today.       Home Medications Prior to Admission medications   Medication Sig Start Date End Date Taking? Authorizing Provider  diclofenac (VOLTAREN) 50 MG EC tablet Take 1 tablet (50 mg total) by mouth 2 (two) times daily for 15 days. 06/10/23 06/25/23 Yes Jeannie Fend, PA-C  acetaminophen (TYLENOL) 325 MG tablet Take 2 tablets (650 mg total) by mouth every 6 (six) hours as needed for mild pain or moderate pain. 06/25/17   Mack Hook, MD  fluticasone [facility]) 50 MCG/ACT nasal spray Place 2 sprays into both nostrils daily. 01/23/19   Couture, Cortni S, PA-C      Allergies    Patient has no known allergies.    Review of Systems   Review of Systems Negative except as per HPI Physical Exam Updated Vital Signs BP (!) 131/92 (BP Location: Right Arm)   Pulse 89   Temp 98.2 F (36.8 C) (Oral)   Resp 18   Ht 5\' 10"  (1.778 m)   Wt 107 kg   SpO2 99%   BMI 33.86 kg/m  Physical Exam Vitals and nursing note reviewed.  Constitutional:      General: He is not in acute distress.    Appearance: He is well-developed. He is not diaphoretic.  HENT:     Head: Normocephalic and atraumatic.  Cardiovascular:     Pulses: Normal pulses.  Pulmonary:     Effort: Pulmonary effort  is normal.  Musculoskeletal:        General: Tenderness present. No swelling or deformity.     Left knee: No bony tenderness or crepitus. Normal range of motion. No tenderness. No LCL laxity, MCL laxity, ACL laxity or PCL laxity.Normal meniscus and normal patellar mobility. Normal pulse.     Instability Tests: Anterior drawer test negative. Posterior drawer test negative. Anterior Lachman test negative. Medial McMurray test negative and lateral McMurray test negative.       Legs:     Comments: Swelling with palpation along left posterior thigh along hamstrings, worse with straight leg extension, no weakness.  No sensory deficit.  Skin:    General: Skin is warm and dry.     Findings: No erythema or rash.  Neurological:     Mental Status: He is alert and oriented to person, place, and time.     Sensory: No sensory deficit.     Motor: No weakness.  Psychiatric:        Behavior: Behavior normal.     ED Results / Procedures / Treatments   Labs (all labs ordered are listed, but only abnormal results are displayed) Labs Reviewed - No data to display  EKG None  Radiology DG Knee Complete 4 Views Left  Result Date: 06/10/2023 CLINICAL DATA:  Knee pain after injury playing basketball EXAM: LEFT KNEE - COMPLETE 4+ VIEW COMPARISON:  None Available. FINDINGS: No evidence of fracture, dislocation, or joint effusion. No evidence of arthropathy or other focal bone abnormality. Soft tissues are unremarkable. IMPRESSION: Negative. Electronically Signed   By: Minerva Fester M.D.   On: 06/10/2023 00:26    Procedures Procedures    Medications Ordered in ED Medications - No data to display  ED Course/ Medical Decision Making/ A&P                             Medical Decision Making  27 year old male with posterior left leg pain x 7 months.  X-ray of the left knee is obtained in triage is negative for acute bony abnormality.  Suspect hamstrings injury.  Doubt DVT given location of pain and  duration, no lower extremity swelling.  Plan is to trial course of NSAIDs and crutches to weight-bear as tolerated.  Referred to orthopedics for follow-up if not improving.        Final Clinical Impression(s) / ED Diagnoses Final diagnoses:  Strain of left hamstring, initial encounter    Rx / DC Orders ED Discharge Orders          Ordered    diclofenac (VOLTAREN) 50 MG EC tablet  2 times daily        06/10/23 0323              Jeannie Fend, PA-C 06/10/23 0329    Marily Memos, MD 06/10/23 [phone]

## 2023-06-10 NOTE — ED Notes (Signed)
Patient was given crutches and was able to demonstrate appropriate usage of the crutches.

## 2023-06-10 NOTE — Discharge Instructions (Signed)
Take diclofenac twice daily as prescribed.  Use crutches to weight-bear as tolerated.  Follow-up with orthopedics in 1 week if not improving.

## 2023-07-02 ENCOUNTER — Ambulatory Visit (INDEPENDENT_AMBULATORY_CARE_PROVIDER_SITE_OTHER): Payer: Self-pay | Admitting: Orthopaedic Surgery

## 2023-07-02 DIAGNOSIS — M76899 Other specified enthesopathies of unspecified lower limb, excluding foot: Secondary | ICD-10-CM

## 2023-07-02 NOTE — Progress Notes (Signed)
Chief Complaint: Left gastroc, hamstring pain     History of Present Illness:    Guy Clark is a 27 y.o. male presents today with ongoing left gastroc and predominantly posterior hamstring pain laterally.  He is experiencing this since he played basketball 1 year prior and since that time has had what he felt like an injury was in the quad but subsequently has been more painful in his hamstring.  He is very active and enjoys playing basketball.  He does state that he recently attempted to do a leg press and felt immediate pain in the posterior lateral aspect of the hamstring and as result stopped.    Surgical History:   None  PMH/PSH/Family History/Social History/Meds/Allergies:    Past Medical History:  Diagnosis Date   Metacarpal bone fracture 06/17/2017   left 4th   Past Surgical History:  Procedure Laterality Date   CLOSED REDUCTION METACARPAL WITH PERCUTANEOUS PINNING Left 06/25/2017   Procedure: PINNING OF LEFT 4TH METACARPAL FRACTURE;  Surgeon: Mack Hook, MD;  Location: [facility];  Service: Orthopedics;  Laterality: Left;   Social History   Socioeconomic History   Marital status: Single    Spouse name: Not on file   Number of children: Not on file   Years of education: Not on file   Highest education level: Not on file  Occupational History   Not on file  Tobacco Use   Smoking status: Former    Current packs/day: 0.00    Types: Cigarettes    Quit date: 12/18/2007    Years since quitting: 15.5   Smokeless tobacco: Never  Vaping Use   Vaping status: Some Days  Substance and Sexual Activity   Alcohol use: No   Drug use: No   Sexual activity: Not on file  Other Topics Concern   Not on file  Social History Narrative   Not on file   Social Determinants of Health   Financial Resource Strain: Not on file  Food Insecurity: Not on file  Transportation Needs: Not on file  Physical Activity: Not on file   Stress: Not on file  Social Connections: Not on file   No family history on file. No Known Allergies Current Outpatient Medications  Medication Sig Dispense Refill   acetaminophen (TYLENOL) 325 MG tablet Take 2 tablets (650 mg total) by mouth every 6 (six) hours as needed for mild pain or moderate pain.     fluticasone (FLONASE) 50 MCG/ACT nasal spray Place 2 sprays into both nostrils daily. 16 g 0   No current facility-administered medications for this visit.   No results found.  Review of Systems:   A ROS was performed including pertinent positives and negatives as documented in the HPI.  Physical Exam :   Constitutional: NAD and appears stated age Neurological: Alert and oriented Psych: Appropriate affect and cooperative There were no vitals taken for this visit.   Comprehensive Musculoskeletal Exam:    Tenderness palpation about the hamstring as it inserts laterally at the knee.  Tenderness to palpation about the lateral gastroc head.  Minimal to no joint line tenderness, negative McMurray, negative Lachman.  Imaging:   Xray (3 views left knee): Normal   I personally reviewed and interpreted the radiographs.   Assessment:   27 y.o. male with left posterior  hamstring and lateral gastroc head tightness and pain after playing basketball 1 year prior.  He states that is never felt normal since that time.  At this time I have advised that I believe it is less likely that there is no internal derangement of the knee versus symptoms associated with his hamstring and gastrocnemius.  At this point we will plan for referral to Dr. Shon Baton for assessment of shockwave candidacy for the hamstring and gastroc.  I would also like to get him involved with physical therapy for strengthening as well as possible dry needling  Plan :    -Return to clinic as needed     I personally saw and evaluated the patient, and participated in the management and treatment plan.  Huel Cote,  MD Attending Physician, Orthopedic Surgery  This document was dictated using Dragon voice recognition software. A reasonable attempt at proof reading has been made to minimize errors.

## 2023-07-02 NOTE — Addendum Note (Signed)
Addended by: Benancio Deeds on: 07/02/2023 11:30 AM   Modules accepted: Level of Service

## 2023-07-12 ENCOUNTER — Ambulatory Visit: Payer: Self-pay | Admitting: Sports Medicine

## 2023-12-02 ENCOUNTER — Other Ambulatory Visit: Payer: Self-pay

## 2023-12-02 DIAGNOSIS — X58XXXA Exposure to other specified factors, initial encounter: Secondary | ICD-10-CM | POA: Insufficient documentation

## 2023-12-02 DIAGNOSIS — S76312A Strain of muscle, fascia and tendon of the posterior muscle group at thigh level, left thigh, initial encounter: Secondary | ICD-10-CM | POA: Diagnosis not present

## 2023-12-02 DIAGNOSIS — Y9367 Activity, basketball: Secondary | ICD-10-CM | POA: Diagnosis not present

## 2023-12-02 DIAGNOSIS — S8992XA Unspecified injury of left lower leg, initial encounter: Secondary | ICD-10-CM | POA: Diagnosis present

## 2023-12-03 ENCOUNTER — Emergency Department (HOSPITAL_BASED_OUTPATIENT_CLINIC_OR_DEPARTMENT_OTHER)
Admission: EM | Admit: 2023-12-03 | Discharge: 2023-12-03 | Disposition: A | Discharge status: Home / Self Care | Payer: Self-pay | Attending: Emergency Medicine | Admitting: Emergency Medicine

## 2023-12-03 ENCOUNTER — Other Ambulatory Visit: Payer: Self-pay

## 2023-12-03 DIAGNOSIS — S76312A Strain of muscle, fascia and tendon of the posterior muscle group at thigh level, left thigh, initial encounter: Secondary | ICD-10-CM

## 2023-12-03 MED ORDER — OXYCODONE-ACETAMINOPHEN 5-325 MG PO TABS
2.0000 | ORAL_TABLET | Freq: Once | ORAL | Status: AC
  Administered 2023-12-03: 2 via ORAL
  Filled 2023-12-03: qty 2

## 2023-12-03 MED ORDER — OXYCODONE-ACETAMINOPHEN 5-325 MG PO TABS: 1.0000 | ORAL_TABLET | Freq: Four times a day (QID) | ORAL | 0 refills | Status: DC | PRN

## 2023-12-03 NOTE — Discharge Instructions (Signed)
Wear Ace bandage for comfort and support.  Weightbearing as tolerated.  Take ibuprofen 600 mg every 6 hours as needed for pain.  Begin taking oxycodone as prescribed as needed for pain not relieved with ibuprofen.  Follow-up with orthopedics this week.  The contact information for Dr. Susa Simmonds has been provided in this discharge summary for you to call and make these arrangements.

## 2023-12-03 NOTE — ED Triage Notes (Signed)
Pt POV from home reporting persistent pain in L hamstring, was seen last week and told he tore it. Prescribed oxycodone w minimal relief.

## 2023-12-03 NOTE — ED Provider Notes (Signed)
[place] EMERGENCY DEPARTMENT AT [facility] Provider Note   CSN: [id number] Arrival date & time: 12/02/23  2356     History  Chief Complaint  Patient presents with   Leg Pain    Guy Clark is a 27 y.o. male.  Patient is a 27 year old male presenting with complaints of left leg pain.  He reports playing basketball week when he injured his left hamstring.  He was seen at a hospital in [place] and was told he probably tore it.  He was given crutches and pain medication and advised to follow-up with orthopedics.  Patient returns to [facility] complaining of ongoing pain.  He reports being out of his oxycodone and pain is severe.  No new injury or trauma.  The history is provided by the patient.       Home Medications Prior to Admission medications   Medication Sig Start Date End Date Taking? Authorizing Provider  acetaminophen (TYLENOL) 325 MG tablet Take 2 tablets (650 mg total) by mouth every 6 (six) hours as needed for mild pain or moderate pain. 06/25/17   Mack Hook, MD  fluticasone [facility]) 50 MCG/ACT nasal spray Place 2 sprays into both nostrils daily. 01/23/19   Couture, Cortni S, PA-C      Allergies    Patient has no known allergies.    Review of Systems   Review of Systems  All other systems reviewed and are negative.   Physical Exam Updated Vital Signs BP 122/67   Pulse 68   Temp 98.2 F (36.8 C) (Oral)   Resp 17   Ht 5\' 10"  (1.778 m)   Wt 102.1 kg   SpO2 99%   BMI 32.28 kg/m  Physical Exam Vitals and nursing note reviewed.  Constitutional:      Appearance: Normal appearance.  HENT:     Head: Normocephalic.  Pulmonary:     Effort: Pulmonary effort is normal.  Musculoskeletal:     Comments: The left lower extremity is grossly normal in appearance.  There is tenderness to palpation over the posterior thigh over the area of the hamstrings.  DP pulses are easily palpable and motor and sensation are intact throughout the entire  foot.  Skin:    General: Skin is warm and dry.  Neurological:     Mental Status: He is alert and oriented to person, place, and time.     ED Results / Procedures / Treatments   Labs (all labs ordered are listed, but only abnormal results are displayed) Labs Reviewed - No data to display  EKG None  Radiology No results found.  Procedures Procedures    Medications Ordered in ED Medications  oxyCODONE-acetaminophen (PERCOCET/ROXICET) 5-325 MG per tablet 2 tablet (has no administration in time range)    ED Course/ Medical Decision Making/ A&P  Patient presenting with ongoing pain in his leg following a hamstrings injury that occurred last week.  There is tenderness over the area of, but no palpable or visible abnormality.  Patient will be referred to orthopedics.  I will also wrap his thigh with an Ace bandage for compression as I believe this may give him some comfort.  I will also prescribe additional pain medication as he tells me he ran out of what he was given in [place].  Final Clinical Impression(s) / ED Diagnoses Final diagnoses:  None    Rx / DC Orders ED Discharge Orders     None         Meenakshi Sazama, Riley Lam,  MD 12/03/23 4034

## 2023-12-21 ENCOUNTER — Encounter: Payer: Self-pay | Admitting: Sports Medicine

## 2023-12-21 ENCOUNTER — Ambulatory Visit (INDEPENDENT_AMBULATORY_CARE_PROVIDER_SITE_OTHER): Payer: Self-pay | Admitting: Sports Medicine

## 2023-12-21 DIAGNOSIS — S76302S Unspecified injury of muscle, fascia and tendon of the posterior muscle group at thigh level, left thigh, sequela: Secondary | ICD-10-CM

## 2023-12-21 DIAGNOSIS — M79605 Pain in left leg: Secondary | ICD-10-CM

## 2023-12-21 MED ORDER — DICLOFENAC SODIUM 50 MG PO TBEC: 50.0000 mg | DELAYED_RELEASE_TABLET | Freq: Two times a day (BID) | ORAL | 0 refills | Status: DC

## 2023-12-21 NOTE — Progress Notes (Signed)
 Guy Clark - 28 y.o. male MRN [id number]  Date of birth: January 21, 1996  Office Visit Note: Visit Date: 12/21/2023 PCP: Patient, No Pcp Per Referred by: Genelle Standing, MD  Subjective: Chief Complaint  Patient presents with   Left Leg - Pain   HPI: Guy Clark is a pleasant 28 y.o. male who presents today for chronic left hamstring pain and injury.  He initially had his injury over the summer when he was playing basketball and he was running down the court and return sharply and felt a pop and a pain in the mid hamstring/thigh.  He was able to continue playing slightly but at the end needed to stop and has some soreness and pain following the game.  His pain worsened and he was seen by my partner, Dr. Genelle, on 07/02/2023.  At that time he was referred for possible extracorporeal shockwave therapy.  Although Delio states that he did take anti-inflammatories, weaned off crutches and after period of 2 or 3 weeks he felt much better so he did not think he needed this.  Unfortunately his pain has waxed and waned and he has been seen in the emergency room both in [place]  where he was staying in December and then subsequently again on 12/03/2023 at [place] location.  *Independent note reviewed from 12/03/2023 with concern for hamstring injury.  They did apply Ace bandage for compression and did give him a few tablets of Percocet 5-325 mg.  *I did review his note from care everywhere in [place]  from [facility] in [place] .  They did have concern for hamstring injury and had him follow-up with orthopedic surgery down there but he has returned to [facility]  They did give him a few tablets of Roxicodone  and recommended heat.  Daronte tells me today that he did stay in [place]  for 2 days and he had an x-ray and the providers at that location told him that he thought he needed surgery, however he wanted to follow-up with orthopedics here in [place].  To his  knowledge and in my chart review, he has not had any MRI or advanced imaging to date.  His pain recently has gotten worse he is using a crutch because of the pain.  He has pain from the distal knee that radiates up to the ischial tuberosity but most of his pain is in the mid aspect of the hamstring.  He has limited mobility because of this. He has done so stretching and other home therapy at times. During his initial injury he had swelling but denied any gross ecchymosis at the time.   Pertinent ROS were reviewed with the patient and found to be negative unless otherwise specified above in HPI.   Assessment & Plan: Visit Diagnoses:  1. Left hamstring injury, sequela   2. 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  Initial injury happened playing basketball over the summer where he was partial weightbearing with crutches with anti-inflammatories and activity modification for his pain somewhat improved.  He has done stretching and some home therapy without lasting relief.  His pain never fully went away and has recently been exacerbated in December with ED visits both in [place]  and here back in [place] last month.  He is having difficulty returning to full activity and still using a crutch today for ambulation.  We will start him  back on diclofenac  50 mg to be taken once to twice daily for pain control.  At this point given the chronicity and reexacerbation, I believe we need to move forward with MRI to evaluate his hamstring tendons to see what degree of tear or injury he has sustained to help guide treatment.  Depending on the degree of injury, may consider formalized PT, extracorporeal shockwave therapy.  If there is full thickness tendon tearing, neck step may include surgical fixation.  I will call/message patient once his MRI returns to discuss next steps.  Follow-up:  Return for will call/message him after MRI to discuss next steps (ECSWT vs. surgical referral).   Meds & Orders:  Meds ordered this encounter  Medications   diclofenac  (VOLTAREN ) 50 MG EC tablet    Sig: Take 1 tablet (50 mg total) by mouth 2 (two) times daily.    Dispense:  40 tablet    Refill:  0    Orders Placed This Encounter  Procedures   MR FEMUR LEFT WO CONTRAST     Procedures: No procedures performed      Clinical History: No specialty comments available.  He reports that he quit smoking about 16 years ago. His smoking use included cigarettes. He has never used smokeless tobacco. No results for input(s): HGBA1C, LABURIC in the last 8760 hours.  Objective:    Physical Exam  Gen: Well-appearing, in no acute distress; non-toxic CV: Well-perfused. Warm.  Resp: Breathing unlabored on room air; no wheezing. Psych: Fluid speech in conversation; appropriate affect; normal thought process  Ortho Exam - LLE: + TTP in the mid and more so distal aspect of the hamstring origin, specifically vastus lateralis.  He has pain with resisted hamstring curl and weakness associated with this.  There does appear to be a small defect or thickening of the tendon in this region just proximal to the knee.  There is mild TTP at the mid to proximal insertion but no ischial tuberosity TTP.  No ecchymosis noted.  Patient walks with the assistance of a crutch.  Imaging:  DG Knee Complete 4 Views Left CLINICAL DATA:  Knee pain after injury playing basketball  EXAM: LEFT KNEE - COMPLETE 4+ VIEW  COMPARISON:  None Available.  FINDINGS: No evidence of fracture, dislocation, or joint effusion. No evidence of arthropathy or other focal bone abnormality. Soft tissues are unremarkable.  IMPRESSION: Negative.  Electronically Signed   By: Norman Gatlin M.D.   On: 06/10/2023 00:26  Past Medical/Family/Surgical/Social History: Medications & Allergies reviewed per EMR, new medications  updated. There are no active problems to display for this patient.  Past Medical History:  Diagnosis Date   Metacarpal bone fracture 06/17/2017   left 4th   No family history on file. Past Surgical History:  Procedure Laterality Date   CLOSED REDUCTION METACARPAL WITH PERCUTANEOUS PINNING Left 06/25/2017   Procedure: PINNING OF LEFT 4TH METACARPAL FRACTURE;  Surgeon: Sebastian Lenis, MD;  Location: [facility];  Service: Orthopedics;  Laterality: Left;   Social History   Occupational History   Not on file  Tobacco Use   Smoking status: Former    Current packs/day: 0.00    Types: Cigarettes    Quit date: 12/18/2007    Years since quitting: 16.0   Smokeless tobacco: Never  Vaping Use   Vaping status: Some Days  Substance and Sexual Activity   Alcohol use: No   Drug use: No   Sexual activity: Not on file

## 2023-12-21 NOTE — Progress Notes (Signed)
 Patient says that he initially hurt his hamstring over the summer while playing basketball. He says that it did get better, but about 2 months ago it began feeling sore and bothering him again. He says that he went to [place]  about 1 month ago where they wanted him to have surgery. He is still having pain through the entire hamstring, and is currently using one crutch. Patient is able to flex and extend his knee, but he says that he feels that he will get a cramp with flexion. Patient also describes hamstring as tender to touch.

## 2023-12-23 ENCOUNTER — Other Ambulatory Visit: Payer: Self-pay | Admitting: Sports Medicine

## 2023-12-23 MED ORDER — DICLOFENAC SODIUM 50 MG PO TBEC: 50.0000 mg | DELAYED_RELEASE_TABLET | Freq: Two times a day (BID) | ORAL | 0 refills | Status: DC

## 2023-12-28 ENCOUNTER — Ambulatory Visit (HOSPITAL_COMMUNITY)
Admission: RE | Admit: 2023-12-28 | Discharge: 2023-12-28 | Disposition: A | Discharge status: Home / Self Care | Payer: Medicaid Other | Source: Ambulatory Visit | Attending: Sports Medicine | Admitting: Sports Medicine

## 2023-12-28 DIAGNOSIS — S76302A Unspecified injury of muscle, fascia and tendon of the posterior muscle group at thigh level, left thigh, initial encounter: Secondary | ICD-10-CM | POA: Diagnosis present

## 2023-12-28 DIAGNOSIS — M79605 Pain in left leg: Secondary | ICD-10-CM | POA: Insufficient documentation

## 2023-12-28 DIAGNOSIS — S76302S Unspecified injury of muscle, fascia and tendon of the posterior muscle group at thigh level, left thigh, sequela: Secondary | ICD-10-CM | POA: Diagnosis present

## 2023-12-28 DIAGNOSIS — G8929 Other chronic pain: Secondary | ICD-10-CM | POA: Insufficient documentation

## 2024-01-10 ENCOUNTER — Encounter: Payer: Self-pay | Admitting: Sports Medicine

## 2024-01-10 ENCOUNTER — Ambulatory Visit: Payer: Medicaid Other | Admitting: Sports Medicine

## 2024-01-10 DIAGNOSIS — S76302S Unspecified injury of muscle, fascia and tendon of the posterior muscle group at thigh level, left thigh, sequela: Secondary | ICD-10-CM

## 2024-01-10 DIAGNOSIS — S76302D Unspecified injury of muscle, fascia and tendon of the posterior muscle group at thigh level, left thigh, subsequent encounter: Secondary | ICD-10-CM

## 2024-01-10 DIAGNOSIS — R29898 Other symptoms and signs involving the musculoskeletal system: Secondary | ICD-10-CM

## 2024-01-10 DIAGNOSIS — M79605 Pain in left leg: Secondary | ICD-10-CM | POA: Diagnosis not present

## 2024-01-10 NOTE — Progress Notes (Signed)
Patient says that he is feeling okay. He says that he went to [facility] yesterday without the crutch and by the time he got to the back of the store had a good amount of pain. He does not have the crutch today.

## 2024-01-10 NOTE — Progress Notes (Signed)
Guy Clark - 28 y.o. male MRN [id number]  Date of birth: Feb 03, 1996  Office Visit Note: Visit Date: 01/10/2024 PCP: Patient, No Pcp Per Referred by: Madelyn Brunner, DO  Subjective: Chief Complaint  Patient presents with   Left Leg - Follow-up   HPI: Guy Clark is a pleasant 28 y.o. male who presents today for follow-up of chronic left hamstring injury.  As a reminder, He initially had his injury over the summer when he was playing basketball and he was running down the court and return sharply and felt a pop and a pain in the mid hamstring/thigh.  He was able to continue playing slightly but at the end needed to stop and has some soreness and pain following the game.  His pain worsened and he was seen by my partner, Dr. Steward Drone, on 07/02/2023.  At that time he was referred for possible extracorporeal shockwave therapy.  Although Guy Clark states that he did take anti-inflammatories, weaned off crutches and after period of 2 or 3 weeks he felt much better so he did not think he needed this.   Today, Guy Clark states he is feeling okay.  He has been able to wean off of the crutches.  He will have some soreness/throbbing when he is standing or walking for long peers of time.  He has not yet picked up his diclofenac medication.  Pertinent ROS were reviewed with the patient and found to be negative unless otherwise specified above in HPI.   Assessment & Plan: Visit Diagnoses:  1. Left hamstring injury, sequela   2. Pain in left leg   3. Left leg weakness    Plan: Impression is chronic left hamstring pain with resultant weakness after a previous injury to the left hamstring origin over the summer.  Did review his MRI which shows an intact tendon but there is scar tissue over his area of pain.  In shared decision making, we did proceed with a trial of extracorporeal shockwave therapy.  We also discussed the importance of getting him into formalized physical therapy for the hamstring musculature  and to work on weakness as he does have a degree of muscle atrophy/weakness compared to the contralateral leg. PT referral sent today. He will follow-up next week for repeat ESWT and further evaluation and treatment.  Follow-up: Return in about 1 week (around 01/17/2024) for L-hamstring injury (reg visit).   Meds & Orders: No orders of the defined types were placed in this encounter.   Orders Placed This Encounter  Procedures   Ambulatory referral to Physical Therapy     Procedures: Procedure: ECSWT Indications:  Prior hamstring injury/partial tear   Procedure Details Consent: Risks of procedure as well as the alternatives and risks of each were explained to the patient.  Verbal consent for procedure obtained. Time Out: Verified patient identification, verified procedure, site was marked, verified correct patient position. The area was cleaned with alcohol swab.     The left hamstring was targeted for Extracorporeal shockwave therapy.    Preset: Status post muscular injury Power Level: 120 mJ Frequency: 13-14 Hz Impulse/cycles: 3200 Head size: Regular   Patient tolerated procedure well without immediate complications.         Clinical History: No specialty comments available.  He reports that he quit smoking about 16 years ago. His smoking use included cigarettes. He has never used smokeless tobacco. No results for input(s): "HGBA1C", "LABURIC" in the last 8760 hours.  Objective:    Physical Exam  Gen:  Well-appearing, in no acute distress; non-toxic CV: Well-perfused. Warm.  Resp: Breathing unlabored on room air; no wheezing. Psych: Fluid speech in conversation; appropriate affect; normal thought process  Ortho Exam - LLE: + There is some TTP around the mid to proximal aspect of the hamstring, biceps femoris and possibly overlapping vastus lateralis.  There is a degree of muscle atrophy of his hamstring muscle bulk on the left compared to right side.  There is pain with  resisted knee flexion.  Imaging:  MR FEMUR LEFT WO CONTRAST CLINICAL DATA:  Chronic pain for over 6 months  EXAM: MR OF THE LEFT FEMUR WITHOUT CONTRAST  TECHNIQUE: Multiplanar, multisequence MR imaging of the left femur was performed. No intravenous contrast was administered.  COMPARISON:  None Available.  FINDINGS: Bones/Joint/Cartilage  No fracture or dislocation. Normal alignment. No joint effusion. No marrow signal abnormality.  Muscles and Tendons  Flexors: Normal.  Extensors: Normal.  Abductors: Normal.  Adductors: Normal.  Rotators: Normal.  Hamstrings: Normal.  Soft tissue No fluid collection or hematoma.  No soft tissue mass.  IMPRESSION: 1. Normal MRI of the left femur.  Electronically Signed   By: Elige Ko M.D.   On: 01/06/2024 08:55  Past Medical/Family/Surgical/Social History: Medications & Allergies reviewed per EMR, new medications updated. There are no active problems to display for this patient.  Past Medical History:  Diagnosis Date   Metacarpal bone fracture 06/17/2017   left 4th   History reviewed. No pertinent family history. Past Surgical History:  Procedure Laterality Date   CLOSED REDUCTION METACARPAL WITH PERCUTANEOUS PINNING Left 06/25/2017   Procedure: PINNING OF LEFT 4TH METACARPAL FRACTURE;  Surgeon: Mack Hook, MD;  Location:  [facility];  Service: Orthopedics;  Laterality: Left;   Social History   Occupational History   Not on file  Tobacco Use   Smoking status: Former    Current packs/day: 0.00    Types: Cigarettes    Quit date: 12/18/2007    Years since quitting: 16.0   Smokeless tobacco: Never  Vaping Use   Vaping status: Some Days  Substance and Sexual Activity   Alcohol use: No   Drug use: No   Sexual activity: Not on file

## 2024-01-14 ENCOUNTER — Ambulatory Visit: Payer: Medicaid Other | Admitting: Sports Medicine

## 2024-01-17 ENCOUNTER — Encounter: Payer: Self-pay | Admitting: Sports Medicine

## 2024-01-17 ENCOUNTER — Ambulatory Visit: Payer: Medicaid Other | Admitting: Sports Medicine

## 2024-01-17 DIAGNOSIS — S76302S Unspecified injury of muscle, fascia and tendon of the posterior muscle group at thigh level, left thigh, sequela: Secondary | ICD-10-CM

## 2024-01-17 DIAGNOSIS — S76302D Unspecified injury of muscle, fascia and tendon of the posterior muscle group at thigh level, left thigh, subsequent encounter: Secondary | ICD-10-CM

## 2024-01-17 DIAGNOSIS — M79605 Pain in left leg: Secondary | ICD-10-CM

## 2024-01-17 NOTE — Progress Notes (Signed)
Patient says that he is feeling much better than last week. He says that he had some soreness after the shockwave that lasted for the rest of the day, but the next day he was feeling better. Today he is mostly having some stiffness.

## 2024-01-17 NOTE — Progress Notes (Signed)
Guy Clark - 28 y.o. male MRN [id number]  Date of birth: 1996-12-11  Office Visit Note: Visit Date: 01/17/2024 PCP: Patient, No Pcp Per Referred by: No ref. provider found  Subjective: Chief Complaint  Patient presents with   Left Leg - Follow-up   HPI: Guy Clark is a pleasant 28 y.o. male who presents today for follow-up of left hamstring injury.  Last visit we did perform a treatment of extracorporeal shockwave therapy. Amareon states he noted significant improvement in his pain and function after this.  He has been able to walk around, walk through the grocery store without feeling pain and having to stop to rest.  Not quite 100% better but pretty close on his report.  He is taking diclofenac 50 mg BID.  Pertinent ROS were reviewed with the patient and found to be negative unless otherwise specified above in HPI.   Assessment & Plan: Visit Diagnoses:  1. Left hamstring injury, sequela   2. Pain in left leg    Plan: Impression is chronic left hamstring pain with resultant weakness and some scar tissue noted on MRI.  Marked improvement after our first treatment of extracorporeal shockwave therapy last week.  We did repeat ESWT today, patient tolerated well.  We did send a referral to physical therapy which is currently pending, I would like him to get started in PT and after 1 month after starting physical therapy he will follow-up at that point if he is not 100% improved.  We discussed weaning off of the diclofenac, he will go down to once daily for the next 1-2 weeks and then to only take as needed only after this.   Follow-up: Return for f/u 23-month after starting physical therapy.   Meds & Orders: No orders of the defined types were placed in this encounter.  No orders of the defined types were placed in this encounter.    Procedures: Procedure: ECSWT Indications:  Prior hamstring injury/partial tear   Procedure Details Consent: Risks of procedure as well as the  alternatives and risks of each were explained to the patient.  Verbal consent for procedure obtained. Time Out: Verified patient identification, verified procedure, site was marked, verified correct patient position. The area was cleaned with alcohol swab.     The left hamstring was targeted for Extracorporeal shockwave therapy.    Preset: Status post muscular injury Power Level: 120 mJ Frequency: 14 Hz Impulse/cycles: 3600 Head size: Regular   Patient tolerated procedure well without immediate complications.      Clinical History: No specialty comments available.  He reports that he quit smoking about 16 years ago. His smoking use included cigarettes. He has never used smokeless tobacco. No results for input(s): "HGBA1C", "LABURIC" in the last 8760 hours.  Objective:    Physical Exam  Gen: Well-appearing, in no acute distress; non-toxic CV: Well-perfused. Warm.  Resp: Breathing unlabored on room air; no wheezing. Psych: Fluid speech in conversation; appropriate affect; normal thought process  Ortho Exam - LLE: There is essentially no tenderness to palpation down the mid to proximal aspect of the hamstring tendon.  He does feel a tightness at the distal insertion behind the knee.  There is normal muscle bulk.  Nonantalgic gait.  Imaging: No results found.  Past Medical/Family/Surgical/Social History: Medications & Allergies reviewed per EMR, new medications updated. There are no active problems to display for this patient.  Past Medical History:  Diagnosis Date   Metacarpal bone fracture 06/17/2017   left 4th  No family history on file. Past Surgical History:  Procedure Laterality Date   CLOSED REDUCTION METACARPAL WITH PERCUTANEOUS PINNING Left 06/25/2017   Procedure: PINNING OF LEFT 4TH METACARPAL FRACTURE;  Surgeon: Mack Hook, MD;  Location: [facility];  Service: Orthopedics;  Laterality: Left;   Social History   Occupational History   Not on  file  Tobacco Use   Smoking status: Former    Current packs/day: 0.00    Types: Cigarettes    Quit date: 12/18/2007    Years since quitting: 16.0   Smokeless tobacco: Never  Vaping Use   Vaping status: Some Days  Substance and Sexual Activity   Alcohol use: No   Drug use: No   Sexual activity: Not on file

## 2024-02-05 ENCOUNTER — Other Ambulatory Visit: Payer: Self-pay

## 2024-02-05 ENCOUNTER — Ambulatory Visit: Payer: Medicaid Other | Attending: Sports Medicine

## 2024-02-05 DIAGNOSIS — S76302S Unspecified injury of muscle, fascia and tendon of the posterior muscle group at thigh level, left thigh, sequela: Secondary | ICD-10-CM | POA: Insufficient documentation

## 2024-02-05 DIAGNOSIS — M79605 Pain in left leg: Secondary | ICD-10-CM | POA: Insufficient documentation

## 2024-02-05 NOTE — Therapy (Signed)
 OUTPATIENT PHYSICAL THERAPY LOWER EXTREMITY EVALUATION   Patient Name: Guy Clark MRN: [id number] DOB:[birth year], 28 y.o., male Today's Date: 02/05/2024  END OF SESSION:  PT End of Session - 02/05/24 0829     Visit Number 1    Number of Visits 13    Date for PT Re-Evaluation 03/18/24    Authorization Type [facility]    PT Start Time 0830    PT Stop Time 0901    PT Time Calculation (min) 31 min    Activity Tolerance Patient tolerated treatment well    Behavior During Therapy [facility] for tasks assessed/performed             Past Medical History:  Diagnosis Date   Metacarpal bone fracture 06/17/2017   left 4th   Past Surgical History:  Procedure Laterality Date   CLOSED REDUCTION METACARPAL WITH PERCUTANEOUS PINNING Left 06/25/2017   Procedure: PINNING OF LEFT 4TH METACARPAL FRACTURE;  Surgeon: Mack Hook, MD;  Location: [facility];  Service: Orthopedics;  Laterality: Left;   There are no active problems to display for this patient.   PCP: None reported  REFERRING PROVIDER: Madelyn Brunner, DO   REFERRING DIAG:  [phone] (ICD-10-CM) - Left hamstring injury, sequela    THERAPY DIAG:  Pain in left leg  Hamstring injury, left, sequela  Rationale for Evaluation and Treatment: Rehabilitation  ONSET DATE: July 2024   SUBJECTIVE:   SUBJECTIVE STATEMENT: Patient reports to PT while playing basketball in July 2024. He felt that the extracorporeal shockwave therapy was helpful. Patient reports that he is not having the severe pain anymore, but still feels aching where the tear was.   PERTINENT HISTORY: No relevant PMHx reported    PAIN:  Are you having pain?  Yes: NPRS scale: 3/10 Pain location: L hamstring  Pain description: aching  Aggravating factors: stepping up, playing basketball, running  Relieving factors: hot tub/jacuzzi   PRECAUTIONS: None  RED FLAGS: None   WEIGHT BEARING RESTRICTIONS: No  FALLS:  Has patient fallen in  last 6 months? No  LIVING ENVIRONMENT: Lives with: lives alone Lives in: House/apartment Stairs: Yes: External: "a couple"  Has following equipment at home: None  OCCUPATION: works for [employer]   PLOF: Independent  PATIENT GOALS: Returning to playing basketball on the weekends, To not have any more pain or stiffness  NEXT MD VISIT: 02/14/2024 with referring provider  OBJECTIVE:  Note: Objective measures were completed at Evaluation unless otherwise noted.  DIAGNOSTIC FINDINGS:   12/28/2023 MRI Left Femur  IMPRESSION: 1. Normal MRI of the left femur.  PATIENT SURVEYS:  LEFS 69/80 = 86% Quite a bit of difficulty with normal recreational/sporting activities, and making sharp turns while running fast  Moderate difficulty with prolonged walking, running, hopping and prolonged standing.  COGNITION: Overall cognitive status: Within functional limits for tasks assessed     SENSATION: Not tested  MUSCLE LENGTH: Hamstrings (SLR): Right 85 deg; Left 80 deg  POSTURE: No Significant postural limitations   LOWER EXTREMITY MMT:  MMT Right eval Left eval  Hip flexion 5 4+  Hip extension    Hip abduction 5 4  Hip adduction    Hip internal rotation    Hip external rotation    Knee flexion 5 4  Knee extension    Ankle dorsiflexion 5 5  Ankle plantarflexion    Ankle inversion    Ankle eversion     (Blank rows = not tested)   GAIT: Distance walked: 50  feet from lobby to treatment area  Assistive device utilized: None Level of assistance: Complete Independence Comments: no significant deviations noted                                                                                                                                 TREATMENT DATE:   [facility] Adult PT Treatment:                                                DATE: 02/05/2024   Initial evaluation: see patient education and home exercise program as noted below      PATIENT EDUCATION:  Education  details: reviewed initial home exercise program; discussion of POC, prognosis and goals for skilled PT   Person educated: Patient Education method: Explanation, Demonstration, and Handouts Education comprehension: verbalized understanding, returned demonstration, and needs further education  HOME EXERCISE PROGRAM: Access Code: [email or web address] URL: [email or web address] Date: 02/06/2024 Prepared by: Mauri Reading  Exercises - Sidelying Hip Abduction  - 1 x daily - 4 x weekly - 2 sets - 10 reps - Prone Hip Extension  - 1 x daily - 4 x weekly - 2 sets - 10 reps - Seated Hamstring Curls with Resistance  - 1 x daily - 4 x weekly - 2 sets - 10 reps  ASSESSMENT:  CLINICAL IMPRESSION: Guy Clark is a 28 y.o. male  who was seen today for physical therapy evaluation and treatment for Left Lower Extremity pain with history of L hamstring injury, greater than 3 months ago. He is demonstrating decreased L hip and knee strength, and decreased L hamstring flexibility as compared with R hamstring. He has related pain and difficulty with running, jumping, prolonged standing, prolonged walking, and participation in normal daily/occupational duties. He requires skilled PT services at this time to address relevant deficits and return to PLOF.    OBJECTIVE IMPAIRMENTS: decreased activity tolerance, decreased mobility, decreased strength, improper body mechanics, and pain.   ACTIVITY LIMITATIONS: lifting, standing, squatting, and stairs  PARTICIPATION LIMITATIONS: community activity, occupation, and recreational/physical activity  PERSONAL FACTORS: Time since onset of injury/illness/exacerbation are also affecting patient's functional outcome.   REHAB POTENTIAL: Good  CLINICAL DECISION MAKING: Stable/uncomplicated  EVALUATION COMPLEXITY: Low   GOALS: Goals reviewed with patient? Yes  SHORT TERM GOALS: Target date: 02/26/2024   Patient will be independent with initial home program for LE  strengthening.  Baseline: provided at eval  Goal status: INITIAL   LONG TERM GOALS: Target date: 03/18/2024    Patient will report improved overall functional ability with LEFS score of 75/80 or greater  Baseline: 69/80 Goal status: INITIAL  2.  Patient will demonstrate Left = Right Hamstring flexibility.  Baseline: see objective measures Goal status: INITIAL  3.  Patient will demonstrate 5/5 Left LE strength  Baseline: see objective measures  Goal status: INITIAL  4.  Patient will demonstrate ability to tolerate at least 30 minutes of dynamic weight bearing activities.  Baseline:  Goal status: INITIAL  5.  Patient will report no pain with running and jumping in order to return to playing basketball at least 1x/week.  Baseline:  Goal status: INITIAL  PLAN:  PT FREQUENCY: 1-2x/week  PT DURATION: 6 weeks  PLANNED INTERVENTIONS: 97164- PT Re-evaluation, 97110-Therapeutic exercises, 97530- Therapeutic activity, 97112- Neuromuscular re-education, 97535- Self Care, 16109- Manual therapy, [phone]- Aquatic Therapy, [phone]- Electrical stimulation (unattended), [phone]- Electrical stimulation (manual), [phone]- Ionotophoresis 4mg /ml Dexamethasone, Patient/Family education, Taping, Dry Needling, Joint mobilization, Cryotherapy, and Moist heat  PLAN FOR NEXT SESSION: hamstring and LQ strengthening, hamstring stretch, aerobic and weight bearing activities, progress towards established goals    Mauri Reading, PT, DPT  02/06/2024 10:59 AM  [employer] Authorization   Choose one: Rehabilitative  Standardized Assessment or Functional Outcome Tool: LEFS  Score or Percent Disability: 69/80  Body Parts Treated (Select each separately):  Other Left Leg/Hamstring . Overall deficits/functional limitations for body part selected: moderate   If treatment provided at initial evaluation, no treatment charged due to lack of authorization.

## 2024-02-10 ENCOUNTER — Other Ambulatory Visit: Payer: Self-pay | Admitting: Sports Medicine

## 2024-02-11 MED ORDER — DICLOFENAC SODIUM 50 MG PO TBEC: 50.0000 mg | DELAYED_RELEASE_TABLET | Freq: Two times a day (BID) | ORAL | 0 refills | Status: DC

## 2024-02-13 ENCOUNTER — Ambulatory Visit: Payer: Medicaid Other

## 2024-02-13 DIAGNOSIS — M79605 Pain in left leg: Secondary | ICD-10-CM | POA: Diagnosis not present

## 2024-02-13 DIAGNOSIS — S76302S Unspecified injury of muscle, fascia and tendon of the posterior muscle group at thigh level, left thigh, sequela: Secondary | ICD-10-CM

## 2024-02-13 NOTE — Therapy (Addendum)
 OUTPATIENT PHYSICAL THERAPY LOWER EXTREMITY NOTE   Patient Name: Guy Clark MRN: [id number] DOB:[birth year], 28 y.o., male Today's Date: 02/13/2024   PHYSICAL THERAPY DISCHARGE SUMMARY  Visits from Start of Care: 2  Patient is being discharged due to attendance policy. Patient has no-showed or late cancelled 5 appointments since initial evaluation. He will need new referral if returning to skilled PT.    END OF SESSION:  PT End of Session - 02/13/24 1219     Visit Number 2    Number of Visits 13    Date for PT Re-Evaluation 03/18/24    Authorization Type [facility]    PT Start Time 1218    PT Stop Time 1256    PT Time Calculation (min) 38 min    Activity Tolerance Patient tolerated treatment well    Behavior During Therapy [facility] for tasks assessed/performed              Past Medical History:  Diagnosis Date   Metacarpal bone fracture 06/17/2017   left 4th   Past Surgical History:  Procedure Laterality Date   CLOSED REDUCTION METACARPAL WITH PERCUTANEOUS PINNING Left 06/25/2017   Procedure: PINNING OF LEFT 4TH METACARPAL FRACTURE;  Surgeon: Mack Hook, MD;  Location: [facility];  Service: Orthopedics;  Laterality: Left;   There are no active problems to display for this patient.   PCP: None reported  REFERRING PROVIDER: Madelyn Brunner, DO   REFERRING DIAG:  [phone] (ICD-10-CM) - Left hamstring injury, sequela    THERAPY DIAG:  Pain in left leg  Hamstring injury, left, sequela  Rationale for Evaluation and Treatment: Rehabilitation  ONSET DATE: July 2024   SUBJECTIVE:   SUBJECTIVE STATEMENT: Patient reports no change in left hamstring. No pain noted at start of session.   PERTINENT HISTORY: No relevant PMHx reported    PAIN:  Are you having pain?  Yes: NPRS scale: 3/10 Pain location: L hamstring  Pain description: aching  Aggravating factors: stepping up, playing basketball, running  Relieving factors: hot tub/jacuzzi    PRECAUTIONS: None  RED FLAGS: None   WEIGHT BEARING RESTRICTIONS: No  FALLS:  Has patient fallen in last 6 months? No  LIVING ENVIRONMENT: Lives with: lives alone Lives in: House/apartment Stairs: Yes: External: "a couple"  Has following equipment at home: None  OCCUPATION: works for [employer]   PLOF: Independent  PATIENT GOALS: Returning to playing basketball on the weekends, To not have any more pain or stiffness  NEXT MD VISIT: 02/14/2024 with referring provider  OBJECTIVE:  Note: Objective measures were completed at Evaluation unless otherwise noted.  DIAGNOSTIC FINDINGS:   12/28/2023 MRI Left Femur  IMPRESSION: 1. Normal MRI of the left femur.  PATIENT SURVEYS:  LEFS 69/80 = 86% Quite a bit of difficulty with normal recreational/sporting activities, and making sharp turns while running fast  Moderate difficulty with prolonged walking, running, hopping and prolonged standing.  COGNITION: Overall cognitive status: Within functional limits for tasks assessed     SENSATION: Not tested  MUSCLE LENGTH: Hamstrings (SLR): Right 85 deg; Left 80 deg  POSTURE: No Significant postural limitations   LOWER EXTREMITY MMT:  MMT Right eval Left eval  Hip flexion 5 4+  Hip extension    Hip abduction 5 4  Hip adduction    Hip internal rotation    Hip external rotation    Knee flexion 5 4  Knee extension    Ankle dorsiflexion 5 5  Ankle plantarflexion  Ankle inversion    Ankle eversion     (Blank rows = not tested)   GAIT: Distance walked: 50 feet from lobby to treatment area  Assistive device utilized: None Level of assistance: Complete Independence Comments: no significant deviations noted                                                                                                                                 TREATMENT DATE:   [facility] Adult PT Treatment:                                                DATE: 02/13/2024  Therapeutic  Exercise: Elliptical x 5 minutes, incline 8  Standing knee flexion, 2# ankle weight, x 10 each  Standing hip extension, 2# ankle weight, 2 x 10 each  Standing hip abduction,  2# ankle weight, 2 x 10 each  Squats on BOSU (black side up), 2 x 10  Retro Lunges with slider, 2 x 10  Lateral Lunges with slider, 2 x 10  Seated HS curls into airex pad, 2 x 10 with 3" hold     [facility] Adult PT Treatment:                                                DATE: 02/05/2024   Initial evaluation: see patient education and home exercise program as noted below      PATIENT EDUCATION:  Education details: reviewed initial home exercise program; discussion of POC, prognosis and goals for skilled PT   Person educated: Patient Education method: Explanation, Demonstration, and Handouts Education comprehension: verbalized understanding, returned demonstration, and needs further education  HOME EXERCISE PROGRAM: Access Code: [email or web address] URL: [email or web address] Date: 02/06/2024 Prepared by: Mauri Reading  Exercises - Sidelying Hip Abduction  - 1 x daily - 4 x weekly - 2 sets - 10 reps - Prone Hip Extension  - 1 x daily - 4 x weekly - 2 sets - 10 reps - Seated Hamstring Curls with Resistance  - 1 x daily - 4 x weekly - 2 sets - 10 reps  ASSESSMENT:  CLINICAL IMPRESSION: Tyquarius responded well to initial treatment session today. No increase in symptoms with today's exercises. We will continue to progress as appropriate in order return to PLOF.    OBJECTIVE IMPAIRMENTS: decreased activity tolerance, decreased mobility, decreased strength, improper body mechanics, and pain.   ACTIVITY LIMITATIONS: lifting, standing, squatting, and stairs  PARTICIPATION LIMITATIONS: community activity, occupation, and recreational/physical activity  PERSONAL FACTORS: Time since onset of injury/illness/exacerbation are also affecting patient's functional outcome.   REHAB POTENTIAL: Good  CLINICAL DECISION  MAKING: Stable/uncomplicated  EVALUATION COMPLEXITY: Low  GOALS: Goals reviewed with patient? Yes  SHORT TERM GOALS: Target date: 02/26/2024   Patient will be independent with initial home program for LE strengthening.  Baseline: provided at eval  Goal status: INITIAL   LONG TERM GOALS: Target date: 03/18/2024    Patient will report improved overall functional ability with LEFS score of 75/80 or greater  Baseline: 69/80 Goal status: INITIAL  2.  Patient will demonstrate Left = Right Hamstring flexibility.  Baseline: see objective measures Goal status: INITIAL  3.  Patient will demonstrate 5/5 Left LE strength  Baseline: see objective measures  Goal status: INITIAL  4.  Patient will demonstrate ability to tolerate at least 30 minutes of dynamic weight bearing activities.  Baseline:  Goal status: INITIAL  5.  Patient will report no pain with running and jumping in order to return to playing basketball at least 1x/week.  Baseline:  Goal status: INITIAL  PLAN:  PT FREQUENCY: 1-2x/week  PT DURATION: 6 weeks  PLANNED INTERVENTIONS: 97164- PT Re-evaluation, 97110-Therapeutic exercises, 97530- Therapeutic activity, 97112- Neuromuscular re-education, 97535- Self Care, 40981- Manual therapy, [phone]- Aquatic Therapy, [phone]- Electrical stimulation (unattended), [phone]- Electrical stimulation (manual), [phone]- Ionotophoresis 4mg /ml Dexamethasone, Patient/Family education, Taping, Dry Needling, Joint mobilization, Cryotherapy, and Moist heat  PLAN FOR NEXT SESSION: hamstring and LQ strengthening, hamstring stretch, aerobic and weight bearing activities, progress towards established goals    Mauri Reading, PT, DPT  02/13/2024 1:48 PM  [employer] Authorization   Choose one: Rehabilitative  Standardized Assessment or Functional Outcome Tool: LEFS  Score or Percent Disability: 69/80  Body Parts Treated (Select each separately):  Other Left Leg/Hamstring . Overall  deficits/functional limitations for body part selected: moderate   If treatment provided at initial evaluation, no treatment charged due to lack of authorization.

## 2024-02-14 ENCOUNTER — Ambulatory Visit: Payer: Medicaid Other | Admitting: Physical Therapy

## 2024-02-14 ENCOUNTER — Ambulatory Visit: Payer: Medicaid Other | Admitting: Sports Medicine

## 2024-02-19 ENCOUNTER — Ambulatory Visit: Payer: Medicaid Other | Admitting: Physical Therapy

## 2024-02-20 ENCOUNTER — Ambulatory Visit: Admitting: Sports Medicine

## 2024-02-21 ENCOUNTER — Telehealth: Payer: Self-pay

## 2024-02-21 ENCOUNTER — Ambulatory Visit: Payer: Medicaid Other

## 2024-02-26 ENCOUNTER — Ambulatory Visit: Payer: Medicaid Other

## 2024-02-28 ENCOUNTER — Ambulatory Visit: Payer: Medicaid Other

## 2024-03-10 ENCOUNTER — Ambulatory Visit (INDEPENDENT_AMBULATORY_CARE_PROVIDER_SITE_OTHER): Admitting: Sports Medicine

## 2024-03-10 ENCOUNTER — Encounter: Payer: Self-pay | Admitting: Sports Medicine

## 2024-03-10 DIAGNOSIS — S76302D Unspecified injury of muscle, fascia and tendon of the posterior muscle group at thigh level, left thigh, subsequent encounter: Secondary | ICD-10-CM | POA: Diagnosis not present

## 2024-03-10 DIAGNOSIS — S76302S Unspecified injury of muscle, fascia and tendon of the posterior muscle group at thigh level, left thigh, sequela: Secondary | ICD-10-CM | POA: Diagnosis not present

## 2024-03-10 NOTE — Progress Notes (Signed)
 Guy Clark - 28 y.o. male MRN [id number]  Date of birth: [birth year]  Office Visit Note: Visit Date: 03/10/2024 PCP: Patient, No Pcp Per Referred by: No ref. provider found  Subjective: Chief Complaint  Patient presents with   Left Leg - Follow-up   HPI: Guy Clark is a pleasant 28 y.o. male who presents today for follow-up of resolving left hamstring injury.  Guy Clark reports he is doing very well.  He received excellent relief after our second treatment of shockwave therapy back in January.  He has transition from formalized PT to home therapy and at this point he feels like he is really not having any pain.  He still continues some of his home stretches and exercises during the week in the morning.  He has not needed oral diclofenac in about 3 weeks and is still feeling well.  Pertinent ROS were reviewed with the patient and found to be negative unless otherwise specified above in HPI.   Assessment & Plan: Visit Diagnoses:  1. Left hamstring injury, sequela    Plan: Impression is healed chronic left hamstring injury.  His strength is much improved but he still has to work on stretching to maintain full mobility compared to his contralateral side.  He will continue his PT with transition to HEP, recommended he do these at least 3 times weekly to maintain balance and equivocal range of motion/stretching.  He does have his oral diclofenac 50 mg to take only as needed.  He will follow-up with me on an as-needed basis.  Follow-up: Return if symptoms worsen or fail to improve.   Meds & Orders: No orders of the defined types were placed in this encounter.  No orders of the defined types were placed in this encounter.    Procedures: No procedures performed      Clinical History: No specialty comments available.  He reports that he quit smoking about 16 years ago. His smoking use included cigarettes. He has never used smokeless tobacco. No results for input(s): "HGBA1C",  "LABURIC" in the last 8760 hours.  Objective:   Vital Signs: There were no vitals taken for this visit.  Physical Exam  Gen: Well-appearing, in no acute distress; non-toxic CV: Well-perfused. Warm.  Resp: Breathing unlabored on room air; no wheezing. Psych: Fluid speech in conversation; appropriate affect; normal thought process  Ortho Exam - Left leg: There is no tenderness palpating along the hamstring origin.  He does have about 15-20 degrees less of leg flexion with hamstring stretching compared to his contralateral side, although no reproduction of pain.  Imaging: No results found.  Past Medical/Family/Surgical/Social History: Medications & Allergies reviewed per EMR, new medications updated. There are no active problems to display for this patient.  Past Medical History:  Diagnosis Date   Metacarpal bone fracture 06/17/2017   left 4th   History reviewed. No pertinent family history. Past Surgical History:  Procedure Laterality Date   CLOSED REDUCTION METACARPAL WITH PERCUTANEOUS PINNING Left 06/25/2017   Procedure: PINNING OF LEFT 4TH METACARPAL FRACTURE;  Surgeon: Mack Hook, MD;  Location: [facility];  Service: Orthopedics;  Laterality: Left;   Social History   Occupational History   Not on file  Tobacco Use   Smoking status: Former    Current packs/day: 0.00    Types: Cigarettes    Quit date: 12/18/2007    Years since quitting: 16.2   Smokeless tobacco: Never  Vaping Use   Vaping status: Some Days  Substance and  Sexual Activity   Alcohol use: No   Drug use: No   Sexual activity: Not on file

## 2024-03-10 NOTE — Progress Notes (Signed)
 Patient says that he is doing well. He says that he has been going to physical therapy and doing his home stretches/exercises daily. Those are going well. He has not taken Diclofenac in about 3 weeks and is still feeling good. He says that he may have 1 day out of the week where he feels a bit stiff, but otherwise is no longer having pain.

## 2024-04-14 ENCOUNTER — Emergency Department (HOSPITAL_COMMUNITY)

## 2024-04-14 ENCOUNTER — Other Ambulatory Visit: Payer: Self-pay

## 2024-04-14 ENCOUNTER — Emergency Department (HOSPITAL_COMMUNITY)
Admission: EM | Admit: 2024-04-14 | Discharge: 2024-04-14 | Disposition: A | Discharge status: Home / Self Care | Attending: Emergency Medicine | Admitting: Emergency Medicine

## 2024-04-14 ENCOUNTER — Encounter (HOSPITAL_COMMUNITY): Payer: Self-pay

## 2024-04-14 DIAGNOSIS — S61011A Laceration without foreign body of right thumb without damage to nail, initial encounter: Secondary | ICD-10-CM | POA: Diagnosis not present

## 2024-04-14 DIAGNOSIS — Y92410 Unspecified street and highway as the place of occurrence of the external cause: Secondary | ICD-10-CM | POA: Insufficient documentation

## 2024-04-14 DIAGNOSIS — R519 Headache, unspecified: Secondary | ICD-10-CM | POA: Diagnosis not present

## 2024-04-14 DIAGNOSIS — S80811A Abrasion, right lower leg, initial encounter: Secondary | ICD-10-CM | POA: Insufficient documentation

## 2024-04-14 DIAGNOSIS — Z23 Encounter for immunization: Secondary | ICD-10-CM | POA: Diagnosis not present

## 2024-04-14 DIAGNOSIS — S6991XA Unspecified injury of right wrist, hand and finger(s), initial encounter: Secondary | ICD-10-CM | POA: Diagnosis present

## 2024-04-14 LAB — I-STAT CHEM 8, ED
BUN: 11 mg/dL (ref 6–20)
Calcium, Ion: 1.09 mmol/L — ABNORMAL LOW (ref 1.15–1.40)
Chloride: 106 mmol/L (ref 98–111)
Creatinine, Ser: 1 mg/dL (ref 0.61–1.24)
Glucose, Bld: 163 mg/dL — ABNORMAL HIGH (ref 70–99)
HCT: 46 % (ref 39.0–52.0)
Hemoglobin: 15.6 g/dL (ref 13.0–17.0)
Potassium: 3.6 mmol/L (ref 3.5–5.1)
Sodium: 141 mmol/L (ref 135–145)
TCO2: 23 mmol/L (ref 22–32)

## 2024-04-14 LAB — CBC
HCT: 45.7 % (ref 39.0–52.0)
Hemoglobin: 15 g/dL (ref 13.0–17.0)
MCH: 28.4 pg (ref 26.0–34.0)
MCHC: 32.8 g/dL (ref 30.0–36.0)
MCV: 86.6 fL (ref 80.0–100.0)
Platelets: 351 10*3/uL (ref 150–400)
RBC: 5.28 MIL/uL (ref 4.22–5.81)
RDW: 13 % (ref 11.5–15.5)
WBC: 8.6 10*3/uL (ref 4.0–10.5)
nRBC: 0 % (ref 0.0–0.2)

## 2024-04-14 LAB — SAMPLE TO BLOOD BANK

## 2024-04-14 LAB — COMPREHENSIVE METABOLIC PANEL WITH GFR
ALT: 32 U/L (ref 0–44)
AST: 29 U/L (ref 15–41)
Albumin: 3.5 g/dL (ref 3.5–5.0)
Alkaline Phosphatase: 84 U/L (ref 38–126)
Anion gap: 11 (ref 5–15)
BUN: 9 mg/dL (ref 6–20)
CO2: 21 mmol/L — ABNORMAL LOW (ref 22–32)
Calcium: 8.7 mg/dL — ABNORMAL LOW (ref 8.9–10.3)
Chloride: 107 mmol/L (ref 98–111)
Creatinine, Ser: 1.04 mg/dL (ref 0.61–1.24)
GFR, Estimated: >60 mL/min (ref >60)
Glucose, Bld: 162 mg/dL — ABNORMAL HIGH (ref 70–99)
Potassium: 3.5 mmol/L (ref 3.5–5.1)
Sodium: 139 mmol/L (ref 135–145)
Total Bilirubin: 0.6 mg/dL (ref 0.0–1.2)
Total Protein: 6.5 g/dL (ref 6.5–8.1)

## 2024-04-14 LAB — ETHANOL: Alcohol, Ethyl (B): <15 mg/dL (ref <15)

## 2024-04-14 LAB — I-STAT CG4 LACTIC ACID, ED: Lactic Acid, Venous: 3.6 mmol/L (ref 0.5–1.9)

## 2024-04-14 LAB — PROTIME-INR
INR: 1.1 (ref 0.8–1.2)
Prothrombin Time: 14.1 s (ref 11.4–15.2)

## 2024-04-14 MED ORDER — FENTANYL CITRATE PF 50 MCG/ML IJ SOSY
50.0000 ug | PREFILLED_SYRINGE | Freq: Once | INTRAMUSCULAR | Status: AC
  Administered 2024-04-14: 50 ug via INTRAVENOUS

## 2024-04-14 MED ORDER — IBUPROFEN 800 MG PO TABS
800.0000 mg | ORAL_TABLET | Freq: Once | ORAL | Status: AC
  Administered 2024-04-14: 800 mg via ORAL
  Filled 2024-04-14: qty 1

## 2024-04-14 MED ORDER — METHOCARBAMOL 500 MG PO TABS
500.0000 mg | ORAL_TABLET | Freq: Once | ORAL | Status: AC
  Administered 2024-04-14: 500 mg via ORAL
  Filled 2024-04-14: qty 1

## 2024-04-14 MED ORDER — ACETAMINOPHEN 500 MG PO TABS
1000.0000 mg | ORAL_TABLET | Freq: Once | ORAL | Status: AC
  Administered 2024-04-14: 1000 mg via ORAL
  Filled 2024-04-14: qty 2

## 2024-04-14 MED ORDER — TETANUS-DIPHTH-ACELL PERTUSSIS 5-2.5-18.5 LF-MCG/0.5 IM SUSY
0.5000 mL | PREFILLED_SYRINGE | Freq: Once | INTRAMUSCULAR | Status: AC
  Administered 2024-04-14: 0.5 mL via INTRAMUSCULAR

## 2024-04-14 MED ORDER — IOHEXOL 350 MG/ML SOLN
75.0000 mL | Freq: Once | INTRAVENOUS | Status: AC | PRN
  Administered 2024-04-14: 75 mL via INTRAVENOUS

## 2024-04-14 MED ORDER — FENTANYL CITRATE PF 50 MCG/ML IJ SOSY
PREFILLED_SYRINGE | INTRAMUSCULAR | Status: AC
  Filled 2024-04-14: qty 1

## 2024-04-14 MED ORDER — TETANUS-DIPHTH-ACELL PERTUSSIS 5-2.5-18.5 LF-MCG/0.5 IM SUSY: 0.5000 mL | PREFILLED_SYRINGE | Freq: Once | INTRAMUSCULAR | Status: DC

## 2024-04-14 MED ORDER — FENTANYL CITRATE PF 50 MCG/ML IJ SOSY
50.0000 ug | PREFILLED_SYRINGE | Freq: Once | INTRAMUSCULAR | Status: AC
  Administered 2024-04-14: 50 ug via INTRAVENOUS
  Filled 2024-04-14: qty 1
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
## 2024-04-14 NOTE — ED Notes (Signed)
 Pt removed C- collar off before advised by MD. [place] don the need to maintain c collar until cleared. He stated he knew he was ok.
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
## 2024-04-15 NOTE — Progress Notes (Signed)
 Orthopedic Tech Progress Note Patient Details:  Guy Clark 1996-05-19 [id number] Gave pt crutches and instructions on how to use them per order.  Ortho Devices Type of Ortho Device: Crutches Ortho Device/Splint Location: BLE Ortho Device/Splint Interventions: Ordered, Application, Adjustment   Post Interventions Patient Tolerated: Well Instructions Provided: Adjustment of device, Care of device, Poper ambulation with device  [name] 04/15/2024, 1:02 AM

## 2024-04-18 NOTE — Telephone Encounter (Signed)
 Error

## 2024-05-05 ENCOUNTER — Encounter (HOSPITAL_COMMUNITY): Payer: Self-pay | Admitting: *Deleted

## 2024-05-05 ENCOUNTER — Ambulatory Visit (HOSPITAL_COMMUNITY)
Admission: EM | Admit: 2024-05-05 | Discharge: 2024-05-05 | Disposition: A | Discharge status: Home / Self Care | Attending: Emergency Medicine | Admitting: Emergency Medicine

## 2024-05-05 DIAGNOSIS — Z5189 Encounter for other specified aftercare: Secondary | ICD-10-CM

## 2024-05-05 DIAGNOSIS — L089 Local infection of the skin and subcutaneous tissue, unspecified: Secondary | ICD-10-CM

## 2024-05-05 DIAGNOSIS — T148XXA Other injury of unspecified body region, initial encounter: Secondary | ICD-10-CM

## 2024-05-05 MED ORDER — CEPHALEXIN 500 MG PO CAPS: 500.0000 mg | ORAL_CAPSULE | Freq: Two times a day (BID) | ORAL | 0 refills | Status: AC

## 2024-05-05 MED ORDER — MUPIROCIN CALCIUM 2 % EX CREA: 1.0000 | TOPICAL_CREAM | Freq: Two times a day (BID) | CUTANEOUS | 0 refills | Status: AC

## 2024-05-05 NOTE — Discharge Instructions (Signed)
 Start taking Keflex  twice daily for 5 days for infection coverage. Apply mupirocin  cream twice daily to the wound. Keep the area clean dry and covered. Return here if you notice swelling, spreading of redness, or lots of puslike drainage from the area. If you develop a fever, lightheadedness, or weakness please seek immediate medical treatment in the emergency department
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
## 2024-10-20 ENCOUNTER — Encounter: Payer: Self-pay | Admitting: Radiology
